# Patient Record
Sex: Male | Born: 1955 | Race: White | Hispanic: No | Smoking: Former smoker
Health system: Southern US, Community
[De-identification: ages and names within clinical notes are randomized; demographics above are authoritative.]

## PROBLEM LIST (undated history)

## (undated) ENCOUNTER — Ambulatory Visit (HOSPITAL_COMMUNITY): Admission: EM | Payer: Medicare HMO | Source: Home / Self Care

## (undated) DIAGNOSIS — I10 Essential (primary) hypertension: Secondary | ICD-10-CM

## (undated) DIAGNOSIS — R7303 Prediabetes: Secondary | ICD-10-CM

## (undated) DIAGNOSIS — K449 Diaphragmatic hernia without obstruction or gangrene: Secondary | ICD-10-CM

## (undated) DIAGNOSIS — E785 Hyperlipidemia, unspecified: Secondary | ICD-10-CM

## (undated) HISTORY — DX: Essential (primary) hypertension: I10

## (undated) HISTORY — PX: ANKLE FRACTURE SURGERY: SHX122

## (undated) HISTORY — PX: TONSILLECTOMY: SUR1361

## (undated) HISTORY — PX: POLYPECTOMY: SHX149

## (undated) HISTORY — DX: Diaphragmatic hernia without obstruction or gangrene: K44.9

## (undated) HISTORY — DX: Hyperlipidemia, unspecified: E78.5

---

## 2010-08-20 ENCOUNTER — Other Ambulatory Visit: Payer: Self-pay | Admitting: Internal Medicine

## 2012-08-23 ENCOUNTER — Encounter: Payer: Self-pay | Admitting: Physician Assistant

## 2012-08-23 ENCOUNTER — Ambulatory Visit (INDEPENDENT_AMBULATORY_CARE_PROVIDER_SITE_OTHER): Payer: Managed Care, Other (non HMO) | Admitting: Physician Assistant

## 2012-08-23 VITALS — BP 136/80 | HR 70 | Temp 97.6°F | Resp 16 | Ht 71.0 in | Wt 231.0 lb

## 2012-08-23 DIAGNOSIS — T148 Other injury of unspecified body region: Secondary | ICD-10-CM

## 2012-08-23 DIAGNOSIS — I10 Essential (primary) hypertension: Secondary | ICD-10-CM

## 2012-08-23 DIAGNOSIS — W57XXXA Bitten or stung by nonvenomous insect and other nonvenomous arthropods, initial encounter: Secondary | ICD-10-CM

## 2012-08-23 DIAGNOSIS — E782 Mixed hyperlipidemia: Secondary | ICD-10-CM

## 2012-08-23 MED ORDER — FENOFIBRATE 160 MG PO TABS: 160.0000 mg | ORAL_TABLET | Freq: Every day | ORAL | Status: DC

## 2012-08-23 NOTE — Progress Notes (Signed)
   Patient ID: Frederick Hicks MRN: [id number], DOB: 06/06/1955, 57 y.o. Date of Encounter: 08/23/2012, 9:47 AM    Chief Complaint:  Chief Complaint  Patient presents with  . other    tick bite     HPI: 57 y.o. year old male reports that he found a tic on his right flank 5 days ago. Removed the tic but htinks hte head otf the tic is still "in there.' wants it removed.  No fever, rash, myalgia, flu-like symptoms.  2- HTN: Taking HCTZ and Verapamil. Had lab 2/14.  3-HLD: Taking Fenofibrate. Wants to make sure whether to cont this. Had lab 2/14. LOV 5/13 for this.    Home Meds: See attached medication section for any medications that were entered at today's visit. The computer does not put those onto this list.The following list is a list of meds entered prior to today's visit.   No current outpatient prescriptions on file prior to visit.   No current facility-administered medications on file prior to visit.    Allergies: Not on File    Review of Systems: See HPI for pertinent ROS. All other ROS negative.    Physical Exam: Blood pressure 136/80, pulse 70, temperature 97.6 F (36.4 C), temperature source Oral, resp. rate 16, height 5\' 11"  (1.803 m), weight 231 lb (104.781 kg)., Body mass index is 32.23 kg/(m^2). General: WNWD WM. Appears in no acute distress. Neck: Supple. No thyromegaly. Full ROM. No lymphadenopathy. Lungs: Clear bilaterally to auscultation without wheezes, rales, or rhonchi. Breathing is unlabored. Heart: Regular rhythm. No murmurs, rubs, or gallops. Msk:  Strength and tone normal for age. Skin: Right flank; there is a scab that is approx 0.25x0.50 cm. I see a tiny black dot in right portion of this. I used scalpel tip and removed the head of the tic. There is no surrounding erythema and no purulent drainage. No sign of infection. No rash or erythema migrans.  Neuro: Alert and oriented X 3. Moves all extremities spontaneously. Gait is normal. CNII-XII grossly in  tact. Psych:  Responds to questions appropriately with a normal affect.     ASSESSMENT AND PLAN:  57 y.o. year old male with  1. Tick bite Head of the tic removed. No sign of infection. No sign/symptom of Lyme Disease or RMSF  2. HTN (hypertension) At [place]. BMET nml 05/2012. Cont current meds.  3. Mixed dyslipidemia 05/2010 Simvastatin was stopped sec to myalgias 09/2011 Fenofibrate was added. He was still on this at lab 05/2012. He is to cont Fenofibrate.  - fenofibrate 160 MG tablet; Take 1 tablet (160 mg total) by mouth daily.  Dispense: 30 tablet; Refill: 11  ROV with fasting lab in 6 months.  [address] [place], [place], [facility] 08/23/2012 9:47 AM

## 2012-10-12 ENCOUNTER — Other Ambulatory Visit: Payer: Self-pay | Admitting: Family Medicine

## 2013-02-21 ENCOUNTER — Other Ambulatory Visit: Payer: Self-pay | Admitting: Family Medicine

## 2013-03-01 ENCOUNTER — Telehealth: Payer: Self-pay | Admitting: *Deleted

## 2013-03-01 DIAGNOSIS — E782 Mixed hyperlipidemia: Secondary | ICD-10-CM

## 2013-03-01 MED ORDER — FENOFIBRATE 160 MG PO TABS: 160.0000 mg | ORAL_TABLET | Freq: Every day | ORAL | Status: DC

## 2013-03-01 NOTE — Telephone Encounter (Signed)
Meds refilled.

## 2013-04-09 ENCOUNTER — Telehealth: Payer: Self-pay | Admitting: Family Medicine

## 2013-04-09 MED ORDER — VERAPAMIL HCL ER 240 MG PO TBCR: EXTENDED_RELEASE_TABLET | ORAL | Status: DC

## 2013-04-09 MED ORDER — HYDROCHLOROTHIAZIDE 25 MG PO TABS: ORAL_TABLET | ORAL | Status: DC

## 2013-04-09 NOTE — Telephone Encounter (Signed)
Needs HCTZ and Verapamil refilled- [facility] [address]

## 2013-04-09 NOTE — Telephone Encounter (Signed)
Rx Refilled  

## 2013-05-27 ENCOUNTER — Ambulatory Visit: Payer: Self-pay | Admitting: Family Medicine

## 2013-05-29 ENCOUNTER — Ambulatory Visit (INDEPENDENT_AMBULATORY_CARE_PROVIDER_SITE_OTHER): Payer: BC Managed Care – PPO | Admitting: Family Medicine

## 2013-05-29 ENCOUNTER — Encounter: Payer: Self-pay | Admitting: Family Medicine

## 2013-05-29 VITALS — BP 146/94 | HR 80 | Temp 97.3°F | Resp 18 | Ht 73.0 in | Wt 222.0 lb

## 2013-05-29 DIAGNOSIS — M25579 Pain in unspecified ankle and joints of unspecified foot: Secondary | ICD-10-CM

## 2013-05-29 DIAGNOSIS — G629 Polyneuropathy, unspecified: Secondary | ICD-10-CM

## 2013-05-29 DIAGNOSIS — G589 Mononeuropathy, unspecified: Secondary | ICD-10-CM

## 2013-05-29 NOTE — Progress Notes (Signed)
Subjective:    Patient ID: Frederick Hicks, male    DOB: 1955/12/30, 58 y.o.   MRN: [id number]  HPI Patient is here today for 2 separate medical problems. #1 he complains of sudden and severe pain in his right ankle that occurred after he drove many hours.  He denies any injury to the ankle. The pain lasted approximately one week. He states the ankle is red hot and swollen. It gradually subsided on his iron. He had similar attack in his left knee a few years ago he was installing a former. He believes he may have gout. He has a strong family history of gout in his on call and in his grandfather. He consumes 2-3 I called beverages per night and eats high protein diet. He is also on hydrochlorothiazide.  #2 the patient complains of burning and stinging pain in both feet particularly in the toes. It is worse at the end of the day after he has been walking on hard concrete floors for several hours. It will gradually subside after he is off his feet.  He denies any low back pain or symptoms of cauda equina syndrome. It occurs equally and symmetrically in both feet. No past medical history on file. Current Outpatient Prescriptions on File Prior to Visit  Medication Sig Dispense Refill  . aspirin 81 MG tablet Take 81 mg by mouth daily.      . fenofibrate 160 MG tablet Take 1 tablet (160 mg total) by mouth daily.  30 tablet  11  . hydrochlorothiazide (HYDRODIURIL) 25 MG tablet TAKE 1 TABLET BY MOUTH DAILY  30 tablet  2  . verapamil (CALAN-SR) 240 MG CR tablet TAKE 1 TABLET BY MOUTH DAILY  30 tablet  2   No current facility-administered medications on file prior to visit.   Allergies  Allergen Reactions  . Lisinopril Cough  . Simvastatin Other (See Comments)    Mylagias   History   Social History  . Marital Status: Widowed    Spouse Name: N/A    Number of Children: N/A  . Years of Education: N/A   Occupational History  . Not on file.   Social History Main Topics  . Smoking status: Current  Every Day Smoker    Types: Cigars  . Smokeless tobacco: Not on file  . Alcohol Use: Yes  . Drug Use: No  . Sexual Activity: Not on file   Other Topics Concern  . Not on file   Social History Narrative  . No narrative on file      Review of Systems  All other systems reviewed and are negative.       Objective:   Physical Exam  Vitals reviewed. Constitutional: He is oriented to person, place, and time.  Neck: Neck supple. No thyromegaly present.  Cardiovascular: Normal rate, regular rhythm and normal heart sounds.   Pulmonary/Chest: Effort normal and breath sounds normal.  Abdominal: Soft. Bowel sounds are normal.  Musculoskeletal: Normal range of motion. He exhibits no edema and no tenderness.  Neurological: He is alert and oriented to person, place, and time. He has normal reflexes. He displays normal reflexes. No cranial nerve deficit. He exhibits normal muscle tone. Coordination normal.   examination of the right ankle today is normal. He has normal range of motion without pain. The ankle is stable. There is no evidence of any injury. This patient states he feels much better. Examination of the skin on his feet and his sensation is normal  Assessment & Plan:  1. 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 I will rule out reversible causes of neuropathy by checking TSH B12 and hemoglobin A1c - COMPLETE METABOLIC PANEL WITH GFR - TSH - Vitamin B12 - Hemoglobin A1c  2. Pain in joint, ankle and foot Check a uric acid level. Given the frequency of the attacks, the patient does not warrant starting a daily medicine to prevent gout attacks at the present time. However if his uric acid level is significantly elevated I would change his hydrochlorothiazide to a safer blood pressure medicine. Also recommended a low-protein diet and general restriction on alcohol consumption. - Uric acid

## 2013-05-30 LAB — COMPLETE METABOLIC PANEL WITH GFR
ALBUMIN: 4.5 g/dL (ref 3.5–5.2)
ALK PHOS: 61 U/L (ref 39–117)
ALT: 28 U/L (ref 0–53)
AST: 20 U/L (ref 0–37)
BUN: 12 mg/dL (ref 6–23)
CALCIUM: 9.4 mg/dL (ref 8.4–10.5)
CO2: 28 meq/L (ref 19–32)
Chloride: 102 mEq/L (ref 96–112)
Creat: 0.97 mg/dL (ref 0.50–1.35)
GFR, EST NON AFRICAN AMERICAN: 85 mL/min
GLUCOSE: 82 mg/dL (ref 70–99)
POTASSIUM: 3.7 meq/L (ref 3.5–5.3)
SODIUM: 137 meq/L (ref 135–145)
TOTAL PROTEIN: 6.7 g/dL (ref 6.0–8.3)
Total Bilirubin: 0.5 mg/dL (ref 0.2–1.2)

## 2013-05-30 LAB — TSH: TSH: 2.343 u[IU]/mL (ref 0.350–4.500)

## 2013-05-30 LAB — VITAMIN B12: Vitamin B-12: 282 pg/mL (ref 211–911)

## 2013-05-30 LAB — HEMOGLOBIN A1C
HEMOGLOBIN A1C: 5.6 % (ref <5.7)
Mean Plasma Glucose: 114 mg/dL (ref <117)

## 2013-05-30 LAB — URIC ACID: Uric Acid, Serum: 6.2 mg/dL (ref 4.0–7.8)

## 2013-07-20 ENCOUNTER — Other Ambulatory Visit: Payer: Self-pay | Admitting: Family Medicine

## 2013-07-26 ENCOUNTER — Telehealth: Payer: Self-pay | Admitting: Physician Assistant

## 2013-07-26 MED ORDER — VERAPAMIL HCL ER 240 MG PO TBCR: 240.0000 mg | EXTENDED_RELEASE_TABLET | Freq: Every day | ORAL | Status: DC

## 2013-07-26 MED ORDER — HYDROCHLOROTHIAZIDE 25 MG PO TABS: 25.0000 mg | ORAL_TABLET | Freq: Every day | ORAL | Status: DC

## 2013-07-26 NOTE — Telephone Encounter (Signed)
Medication refilled per protocol. 

## 2013-07-26 NOTE — Telephone Encounter (Signed)
Call back number is [phone] Pharmacy [facility] is needing a refill verapamil (CALAN-SR) 240 MG CR tablet hydrochlorothiazide (HYDRODIURIL) 25 MG tablet

## 2013-11-29 ENCOUNTER — Other Ambulatory Visit: Payer: Self-pay | Admitting: Family Medicine

## 2013-11-29 NOTE — Telephone Encounter (Signed)
Refill appropriate and filled per protocol. 

## 2014-04-11 ENCOUNTER — Other Ambulatory Visit: Payer: Self-pay | Admitting: Physician Assistant

## 2014-04-13 NOTE — Telephone Encounter (Signed)
Refill appropriate and filled per protocol. 

## 2014-08-17 ENCOUNTER — Other Ambulatory Visit: Payer: Self-pay | Admitting: Physician Assistant

## 2014-12-31 ENCOUNTER — Other Ambulatory Visit: Payer: Self-pay | Admitting: Physician Assistant

## 2014-12-31 NOTE — Telephone Encounter (Signed)
Medication filled x1 with no refills.   Requires office visit before any further refills can be given.   Letter sent.  

## 2015-02-11 ENCOUNTER — Encounter: Payer: Self-pay | Admitting: Physician Assistant

## 2015-02-11 ENCOUNTER — Ambulatory Visit (INDEPENDENT_AMBULATORY_CARE_PROVIDER_SITE_OTHER): Payer: BLUE CROSS/BLUE SHIELD | Admitting: Physician Assistant

## 2015-02-11 VITALS — BP 134/104 | HR 72 | Temp 98.4°F | Resp 18 | Wt 225.0 lb

## 2015-02-11 DIAGNOSIS — I10 Essential (primary) hypertension: Secondary | ICD-10-CM | POA: Insufficient documentation

## 2015-02-11 DIAGNOSIS — E785 Hyperlipidemia, unspecified: Secondary | ICD-10-CM

## 2015-02-11 MED ORDER — LOSARTAN POTASSIUM 50 MG PO TABS: 50.0000 mg | ORAL_TABLET | Freq: Every day | ORAL | Status: DC

## 2015-02-12 LAB — LIPID PANEL
CHOL/HDL RATIO: 5.5 ratio — AB (ref <=5.0)
CHOLESTEROL: 194 mg/dL (ref 125–200)
HDL: 35 mg/dL — AB (ref >=40)
LDL Cholesterol: 118 mg/dL (ref <130)
Triglycerides: 203 mg/dL — ABNORMAL HIGH (ref <150)
VLDL: 41 mg/dL — ABNORMAL HIGH (ref <30)

## 2015-02-12 LAB — COMPLETE METABOLIC PANEL WITH GFR
ALBUMIN: 4.4 g/dL (ref 3.6–5.1)
ALK PHOS: 63 U/L (ref 40–115)
ALT: 70 U/L — ABNORMAL HIGH (ref 9–46)
AST: 41 U/L — ABNORMAL HIGH (ref 10–35)
BUN: 14 mg/dL (ref 7–25)
CALCIUM: 9.8 mg/dL (ref 8.6–10.3)
CO2: 30 mmol/L (ref 20–31)
Chloride: 101 mmol/L (ref 98–110)
Creat: 0.92 mg/dL (ref 0.70–1.25)
GFR, Est African American: >89 mL/min (ref >=60)
GFR, Est Non African American: >89 mL/min (ref >=60)
Glucose, Bld: 82 mg/dL (ref 70–99)
POTASSIUM: 4.2 mmol/L (ref 3.5–5.3)
Sodium: 138 mmol/L (ref 135–146)
Total Bilirubin: 0.7 mg/dL (ref 0.2–1.2)
Total Protein: 7 g/dL (ref 6.1–8.1)

## 2015-02-12 NOTE — Progress Notes (Signed)
Patient ID: Frederick Hicks MRN: [id number], DOB: Apr 03, 1956, 59 y.o. Date of Encounter: @DATE @  Chief Complaint:  Chief Complaint  Patient presents with  . Medication Refill  . start of a cold    OK for flu and Zostavax??    HPI: 59 y.o. year old white male  presents for above.  His last office visit with me was on 08/23/2012. At that time for hypertension he was taking HCTZ and verapamil. At that time for hyperlipidemia he was taking fenofibrate.  Today he reports that he quit the fenofibrate about a year ago. He is still taking HCTZ and verapamil daily.  He reports that just yesterday he started feeling his throat get a little scratchy and also started having some sneezing and coughing. No fevers or chills. No significant sore throat. No significant mucus from the nose or phlegm from the chest.  History reviewed. No pertinent past medical history.   Home Meds: Outpatient Prescriptions Prior to Visit  Medication Sig Dispense Refill  . aspirin 81 MG tablet Take 81 mg by mouth daily.    . hydrochlorothiazide (HYDRODIURIL) 25 MG tablet TAKE 1 TABLET BY MOUTH EVERY DAY 30 tablet 0  . verapamil (CALAN-SR) 240 MG CR tablet TAKE 1 TABLET BY MOUTH DAILY 30 tablet 0  . fenofibrate 160 MG tablet Take 1 tablet (160 mg total) by mouth daily. (Patient not taking: Reported on 02/11/2015) 30 tablet 11   No facility-administered medications prior to visit.    Allergies:  Allergies  Allergen Reactions  . Lisinopril Cough  . Simvastatin Other (See Comments)    Mylagias    Social History   Social History  . Marital Status: Widowed    Spouse Name: N/A  . Number of Children: N/A  . Years of Education: N/A   Occupational History  . Not on file.   Social History Main Topics  . Smoking status: Former Smoker    Types: Cigars    Quit date: 04/18/1997  . Smokeless tobacco: Not on file  . Alcohol Use: Yes  . Drug Use: No  . Sexual Activity: Not on file   Other Topics Concern  .  Not on file   Social History Narrative    Family History  Problem Relation Age of Onset  . Gout Maternal Uncle   . Gout Maternal Grandfather      Review of Systems:  See HPI for pertinent ROS. All other ROS negative.    Physical Exam: Blood pressure 134/104, pulse 72, temperature 98.4 F (36.9 C), temperature source Oral, resp. rate 18, weight 225 lb (102.059 kg)., Body mass index is 29.69 kg/(m^2).  Repeat blood pressure by myself I got on the left 160/100 and on the right 160/100 General: WNWD WM. Appears in no acute distress. Head: Normocephalic, atraumatic, eyes without discharge, sclera non-icteric, nares are without discharge. Bilateral auditory canals clear, TM's are without perforation, pearly grey and translucent with reflective cone of light bilaterally. Oral cavity moist, posterior pharynx without exudate, erythema, peritonsillar abscess.  Neck: Supple. No thyromegaly. No lymphadenopathy. No carotid bruits Lungs: Clear bilaterally to auscultation without wheezes, rales, or rhonchi. Breathing is unlabored. Heart: RRR with S1 S2. No murmurs, rubs, or gallops. Abdomen: Soft, non-tender, non-distended with normoactive bowel sounds. No hepatomegaly. No rebound/guarding. No obvious abdominal masses. Musculoskeletal:  Strength and tone normal for age. Extremities/Skin: Warm and dry.No edema.  Neuro: Alert and oriented X 3. Moves all extremities spontaneously. Gait is normal. CNII-XII grossly in tact. Psych:  Responds  to questions appropriately with a normal affect.     ASSESSMENT AND PLAN:  59 y.o. year old male with  1. Essential hypertension H/O Cough with ACE Inh I rechecked blood pressure bilaterally and got 160/100 on each arm. During our conversation he does mention that he was put on verapamil because his heart rate was high in the past. Therefore,  will not change this. Will add losartan 50 mg and also continue his current medications. He is agreeable to return in 2  weeks to follow-up blood pressure and also for complete physical exam at that time. - COMPLETE METABOLIC PANEL WITH GFR - losartan (COZAAR) 50 MG tablet; Take 1 tablet (50 mg total) by mouth daily.  Dispense: 30 tablet; Refill: 3  2. Dyslipidemia He reports that at 9:30 AM he had a diet Coke and a cheese stick. Nothing to eat or drink since then. Therefore will go ahead and check fasting lipid and CMET - COMPLETE METABOLIC PANEL WITH GFR - Lipid panel  He is going to return for follow-up visit in 2 weeks and will schedule that visit as a complete physical exam. Will update immunizations at that time.    Signed, [address] [place], [place], [facility] 02/12/2015 7:35 AM

## 2015-02-16 ENCOUNTER — Other Ambulatory Visit: Payer: Self-pay | Admitting: Physician Assistant

## 2015-02-18 NOTE — Telephone Encounter (Signed)
Medication refilled per protocol. 

## 2015-02-25 ENCOUNTER — Ambulatory Visit (INDEPENDENT_AMBULATORY_CARE_PROVIDER_SITE_OTHER): Payer: BLUE CROSS/BLUE SHIELD | Admitting: Physician Assistant

## 2015-02-25 ENCOUNTER — Encounter: Payer: Self-pay | Admitting: Physician Assistant

## 2015-02-25 VITALS — BP 162/92 | HR 70 | Temp 98.6°F | Resp 16 | Wt 225.0 lb

## 2015-02-25 DIAGNOSIS — Z1212 Encounter for screening for malignant neoplasm of rectum: Secondary | ICD-10-CM | POA: Diagnosis not present

## 2015-02-25 DIAGNOSIS — Z125 Encounter for screening for malignant neoplasm of prostate: Secondary | ICD-10-CM

## 2015-02-25 DIAGNOSIS — Z0000 Encounter for general adult medical examination without abnormal findings: Secondary | ICD-10-CM | POA: Diagnosis not present

## 2015-02-25 DIAGNOSIS — Z1211 Encounter for screening for malignant neoplasm of colon: Secondary | ICD-10-CM

## 2015-02-25 DIAGNOSIS — I10 Essential (primary) hypertension: Secondary | ICD-10-CM

## 2015-02-26 ENCOUNTER — Encounter: Payer: Self-pay | Admitting: Physician Assistant

## 2015-02-26 MED ORDER — LOSARTAN POTASSIUM 100 MG PO TABS: 100.0000 mg | ORAL_TABLET | Freq: Every day | ORAL | Status: DC

## 2015-02-26 NOTE — Progress Notes (Signed)
Patient ID: Frederick Hicks MRN: [id number], DOB: [birth year] 59 y.o. Date of Encounter: 02/26/2015, 8:00 AM    Chief Complaint: Physical (CPE)  HPI: 59 y.o. y/o white male here for CPE.   He has no specific complaints or concerns today. He did have recent office visit with me at which time his blood pressure was elevated. At that visit we added losartan 50 mg daily to his other medications. He states that he is taking this daily with no adverse effects.   Review of Systems: Consitutional: No fever, chills, fatigue, night sweats, lymphadenopathy, or weight changes. Eyes: No visual changes, eye redness, or discharge. ENT/Mouth: Ears: No otalgia, tinnitus, hearing loss, discharge. Nose: No congestion, rhinorrhea, sinus pain, or epistaxis. Throat: No sore throat, post nasal drip, or teeth pain. Cardiovascular: No CP, palpitations, diaphoresis, DOE, edema, orthopnea, PND. Respiratory: No cough, hemoptysis, SOB, or wheezing. Gastrointestinal: No anorexia, dysphagia, reflux, pain, nausea, vomiting, hematemesis, diarrhea, constipation, BRBPR, or melena. Genitourinary: No dysuria, frequency, urgency, hematuria, incontinence, nocturia, decreased urinary stream, discharge, impotence, or testicular pain/masses. Musculoskeletal: No decreased ROM, myalgias, stiffness, joint swelling, or weakness. Skin: No rash, erythema, lesion changes, pain, warmth, jaundice, or pruritis. Neurological: No headache, dizziness, syncope, seizures, tremors, memory loss, coordination problems, or paresthesias. Psychological: No anxiety, depression, hallucinations, SI/HI. Endocrine: No fatigue, polydipsia, polyphagia, polyuria, or known diabetes. All other systems were reviewed and are otherwise negative.  Past Medical History  Diagnosis Date  . Hypertension   . Hiatal hernia     He reports that in his 60s he smoked a lot. Says that he smoked menthol cigarettes which he has been told are the very worst for you. Says  he was smoking 2 packs per day. Says that he ended up having an endoscopy and colonoscopy and was told he had significant esophagitis and hiatal hernia. He has quit smoking. He does drink 3 or 4 beers each day. Says that he has never been able to drink more than 5 beers per day so has never had heavier alcohol use. At visit 02/25/15 discussed decreasing his alcohol and he says that he can definitely decrease that/stop it. Says that regarding the hiatal hernia he got in the habit of sleeping propped up on pillows so he still does this. However he says that he does not have any reflux symptoms and he avoids [place] foods and spicy foods like that.  History reviewed. No pertinent past surgical history.  Home Meds:  Outpatient Prescriptions Prior to Visit  Medication Sig Dispense Refill  . aspirin 81 MG tablet Take 81 mg by mouth daily.    . hydrochlorothiazide (HYDRODIURIL) 25 MG tablet TAKE 1 TABLET BY MOUTH EVERY DAY 30 tablet 0  . verapamil (CALAN-SR) 240 MG CR tablet TAKE 1 TABLET BY MOUTH DAILY 30 tablet 0  . fenofibrate 160 MG tablet Take 1 tablet (160 mg total) by mouth daily. (Patient not taking: Reported on 02/11/2015) 30 tablet 11  . losartan (COZAAR) 50 MG tablet Take 1 tablet (50 mg total) by mouth daily. 30 tablet 3   No facility-administered medications prior to visit.    Allergies:  Allergies  Allergen Reactions  . Lisinopril Cough  . Simvastatin Other (See Comments)    Mylagias    Social History   Social History  . Marital Status: Widowed    Spouse Name: N/A  . Number of Children: N/A  . Years of Education: N/A   Occupational History  . Not on file.   Social History Main  Topics  . Smoking status: Former Smoker    Types: Cigars    Quit date: 04/18/1997  . Smokeless tobacco: Not on file  . Alcohol Use: 10.8 oz/week    18 Cans of beer per week  . Drug Use: No  . Sexual Activity: Not on file   Other Topics Concern  . Not on file   Social History Narrative    Wife passed away--secondary to acute MI   Pt works--binding books   Has son and grandchildren nearby    Family History  Problem Relation Age of Onset  . Gout Maternal Uncle   . Gout Maternal Grandfather   . 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 rate 16, weight 225 lb (102.059 kg).  General: Well developed, well nourished, WM. Appears in no acute distress. HEENT: Normocephalic, atraumatic. Conjunctiva pink, sclera non-icteric. Pupils 2 mm constricting to 1 mm, round, regular, and equally reactive to light and accomodation. EOMI. Internal auditory canal clear. TMs with good cone of light and without pathology. Nasal mucosa pink. Nares are without discharge. No sinus tenderness. Oral mucosa pink. Pharynx without exudate.   Neck: Supple. Trachea midline. No thyromegaly. Full ROM. No lymphadenopathy. No carotid bruit. Lungs: Clear to auscultation bilaterally without wheezes, rales, or rhonchi. Breathing is of normal effort and unlabored. Cardiovascular: RRR with S1 S2. No murmurs, rubs, or gallops. Distal pulses 2+ symmetrically. No carotid or abdominal bruits. Abdomen: Soft, non-tender, non-distended with normoactive bowel sounds. No hepatosplenomegaly or masses. No rebound/guarding. No CVA tenderness. No hernias. Rectal: No external hemorrhoids or fissures. Rectal vault without masses. Prostate gland firm and smooth. No nodularity, tenderness, mass, or induration.  Musculoskeletal: Full range of motion and 5/5 strength throughout. Skin: Warm and moist without erythema, ecchymosis, wounds, or rash. Neuro: A+Ox3. CN II-XII grossly intact. Moves all extremities spontaneously. Full sensation throughout. Normal gait. DTR 2+ throughout upper and lower extremities.  Psych:  Responds to questions appropriately with a normal affect.   Assessment/Plan:  59 y.o. y/o  white male here  for CPE  -1. Visit for preventive health examination  A. Screening Labs: Our office lost power at the time of his visit. He just recently had FLP/LFT. Therefore, do not need to do FLP with this set of labs.  He will return for lab work.  - BASIC METABOLIC PANEL WITH GFR; Future - CBC with Differential/Platelet; Future - TSH; Future - Vit D  25 hydroxy (rtn osteoporosis monitoring); Future - PSA; Future  B. Screening For Prostate Cancer: Our office lost power at the time of his visit.  He will return for lab work.  - PSA; Future  C. Screening For Colorectal Cancer:  He reports that he had a colonoscopy at age 65. Says that he was to follow-up at age 73. Says that at that time he was traveling and missed that colonoscopy and never had it done. Says that the prior colonoscopy was performed in [place]. Showed polyps. He is agreeable for me to place the order for referral to GI for screening colonoscopy at this point. - Ambulatory referral to Gastroenterology  D. Immunizations: Due to power outage, immunizations have been transferred out of our building in order to keep them refrigerated.  Patient will return for immunizations. At that time he will get influenza vaccine and TdaP. He states that he does want to get the seasonal flu vaccine and  states that his last tetanus shot was about 9 or 10 years ago and he does get cut a lot at work with his job so is agreeable to update this. He has no indication to require a pneumonia vaccine until age 18. Regarding the Zostavax he needs to contact his insurance to find out the insurance coverage and what his cost would be and decide whether he wants to receive this.  Flu Tetanus Pneumococcal Zostavax   2. Screening for colorectal cancer He reports that he had a colonoscopy at age 73. Says that he was to follow-up at age 51. Says that at that time he was traveling and missed that colonoscopy and never had it done. Says that the prior  colonoscopy was performed in [place]. Showed polyps. He is agreeable for me to place the order for referral to GI for screening colonoscopy at this point. - Ambulatory referral to Gastroenterology  3. Screening for prostate cancer Our office lost power at the time of his visit.  He will return for lab work.  - PSA; Future  4. Essential hypertension Blood pressure is still elevated. Increase Cozaar from 50 mg to 100 mg daily. Will recheck BMET on this increased dose. He is to return in one week to recheck blood pressure. - BASIC METABOLIC PANEL WITH GFR; Future - losartan (COZAAR) 100 MG tablet; Take 1 tablet (100 mg total) by mouth daily.  Dispense: 30 tablet; Refill: 0  5. History of Hypertriglyceridemia: Recently came and had lipid panel performed. I reviewed these results with him today. He had stopped the fenofibrate about one year ago. Told him that he can stop stay off of this and I have removed this off of the medicine list today. He states that in the past he smoked a lot and drank alcohol and had a poor diet. Says that his diet has changed significantly and he now eats a lot of fish and a lot of vegetables. Also has quit smoking. Discussed that with these lifestyle changes, his lipid panel is now good and he does not require any medication for this.    Signed:   [address] [place], [facility]  02/26/2015 8:00 AM

## 2015-02-27 ENCOUNTER — Encounter: Payer: Self-pay | Admitting: Internal Medicine

## 2015-02-27 ENCOUNTER — Ambulatory Visit (INDEPENDENT_AMBULATORY_CARE_PROVIDER_SITE_OTHER): Payer: BLUE CROSS/BLUE SHIELD | Admitting: *Deleted

## 2015-02-27 DIAGNOSIS — Z23 Encounter for immunization: Secondary | ICD-10-CM

## 2015-03-03 ENCOUNTER — Other Ambulatory Visit: Payer: BLUE CROSS/BLUE SHIELD

## 2015-03-03 ENCOUNTER — Ambulatory Visit: Payer: BLUE CROSS/BLUE SHIELD | Admitting: Family Medicine

## 2015-03-03 VITALS — BP 146/90

## 2015-03-03 DIAGNOSIS — Z0000 Encounter for general adult medical examination without abnormal findings: Secondary | ICD-10-CM

## 2015-03-03 DIAGNOSIS — I10 Essential (primary) hypertension: Secondary | ICD-10-CM

## 2015-03-03 DIAGNOSIS — Z125 Encounter for screening for malignant neoplasm of prostate: Secondary | ICD-10-CM

## 2015-03-04 ENCOUNTER — Telehealth: Payer: Self-pay | Admitting: Family Medicine

## 2015-03-04 DIAGNOSIS — E559 Vitamin D deficiency, unspecified: Secondary | ICD-10-CM

## 2015-03-04 DIAGNOSIS — I10 Essential (primary) hypertension: Secondary | ICD-10-CM

## 2015-03-04 LAB — BASIC METABOLIC PANEL WITH GFR
BUN: 14 mg/dL (ref 7–25)
CHLORIDE: 104 mmol/L (ref 98–110)
CO2: 26 mmol/L (ref 20–31)
CREATININE: 1.02 mg/dL (ref 0.70–1.25)
Calcium: 9.3 mg/dL (ref 8.6–10.3)
GFR, Est African American: >89 mL/min (ref >=60)
GFR, Est Non African American: 80 mL/min (ref >=60)
GLUCOSE: 80 mg/dL (ref 70–99)
POTASSIUM: 4 mmol/L (ref 3.5–5.3)
Sodium: 139 mmol/L (ref 135–146)

## 2015-03-04 LAB — CBC WITH DIFFERENTIAL/PLATELET
Basophils Absolute: 0 10*3/uL (ref 0.0–0.1)
Basophils Relative: 0 % (ref 0–1)
Eosinophils Absolute: 0.1 10*3/uL (ref 0.0–0.7)
Eosinophils Relative: 2 % (ref 0–5)
HEMATOCRIT: 40.4 % (ref 39.0–52.0)
HEMOGLOBIN: 13.9 g/dL (ref 13.0–17.0)
LYMPHS ABS: 2.2 10*3/uL (ref 0.7–4.0)
LYMPHS PCT: 37 % (ref 12–46)
MCH: 32.1 pg (ref 26.0–34.0)
MCHC: 34.4 g/dL (ref 30.0–36.0)
MCV: 93.3 fL (ref 78.0–100.0)
MONO ABS: 0.6 10*3/uL (ref 0.1–1.0)
MONOS PCT: 11 % (ref 3–12)
MPV: 9.9 fL (ref 8.6–12.4)
NEUTROS ABS: 3 10*3/uL (ref 1.7–7.7)
Neutrophils Relative %: 50 % (ref 43–77)
Platelets: 223 10*3/uL (ref 150–400)
RBC: 4.33 MIL/uL (ref 4.22–5.81)
RDW: 13.6 % (ref 11.5–15.5)
WBC: 5.9 10*3/uL (ref 4.0–10.5)

## 2015-03-04 LAB — VITAMIN D 25 HYDROXY (VIT D DEFICIENCY, FRACTURES): VIT D 25 HYDROXY: 16 ng/mL — AB (ref 30–100)

## 2015-03-04 LAB — TSH: TSH: 2.091 u[IU]/mL (ref 0.350–4.500)

## 2015-03-04 LAB — PSA: PSA: 1.3 ng/mL (ref <=4.00)

## 2015-03-04 MED ORDER — NEBIVOLOL HCL 5 MG PO TABS: 5.0000 mg | ORAL_TABLET | Freq: Every day | ORAL | Status: DC

## 2015-03-04 MED ORDER — LOSARTAN POTASSIUM 100 MG PO TABS: 100.0000 mg | ORAL_TABLET | Freq: Every day | ORAL | Status: DC

## 2015-03-04 NOTE — Telephone Encounter (Signed)
-----   Message from Orlena Sheldon, PA-C sent at 03/04/2015  7:41 AM EST ----- He also came in yesterday and had nurse BP check. BP was 146/90.  Tell him to add Bystolic 5mg  QD---Add this to med list---send Rx for # 90---tell him to come by the office and get box of samples and Savings card---can get #90 for $30--so send order for #90 (He says he drives by here on way home from work--should be able to come by and pick this up) Also, add 5 months of refills to Losartan.  Regarding labs: Vitamin D is low---Add otc Vitamin D 4,000 units daily--add to med list. Add Vitamin D Deficiency to Problem List Remainder of labs normal.  Have him schedule f/u OV in one month. Tell him that after that OV, will only need to come every 6 months.

## 2015-03-04 NOTE — Telephone Encounter (Signed)
Pt aware of lab results and provider recommendations.  Rx's to pharmacy.  Discount card left at front desk for pick up.  Understands need for 1 month follow up

## 2015-03-17 ENCOUNTER — Telehealth: Payer: Self-pay | Admitting: Family Medicine

## 2015-03-17 NOTE — Telephone Encounter (Signed)
Ok. Agree

## 2015-03-17 NOTE — Telephone Encounter (Signed)
Pt checking BP at home.  Took immediately after getting home from work and was 160/83  P-54.  Then rested 15-20 min and got 127/75  P-48.  Told him theses were fine.  Made 1 moth follow up appt.  Told him to bring BP log with him to appt.

## 2015-03-19 ENCOUNTER — Ambulatory Visit (AMBULATORY_SURGERY_CENTER): Payer: Self-pay

## 2015-03-19 VITALS — Ht 73.0 in | Wt 227.6 lb

## 2015-03-19 DIAGNOSIS — Z860100 Personal history of colon polyps, unspecified: Secondary | ICD-10-CM

## 2015-03-19 MED ORDER — SUPREP BOWEL PREP KIT 17.5-3.13-1.6 GM/177ML PO SOLN
1.0000 | Freq: Once | ORAL | Status: DC
Start: 2015-03-19 — End: 2015-11-02

## 2015-03-19 NOTE — Progress Notes (Signed)
No home oxygen No diet/weight loss meds No past problems with anesthesia No allergies to eggs or soy  Has email and internet; registered for emmi

## 2015-03-24 ENCOUNTER — Encounter: Payer: Self-pay | Admitting: Internal Medicine

## 2015-03-26 ENCOUNTER — Encounter: Payer: Self-pay | Admitting: Physician Assistant

## 2015-03-26 ENCOUNTER — Ambulatory Visit (INDEPENDENT_AMBULATORY_CARE_PROVIDER_SITE_OTHER): Payer: BLUE CROSS/BLUE SHIELD | Admitting: Physician Assistant

## 2015-03-26 VITALS — BP 154/90 | HR 60 | Temp 98.2°F | Resp 18 | Wt 227.0 lb

## 2015-03-26 DIAGNOSIS — E559 Vitamin D deficiency, unspecified: Secondary | ICD-10-CM

## 2015-03-26 DIAGNOSIS — I10 Essential (primary) hypertension: Secondary | ICD-10-CM | POA: Diagnosis not present

## 2015-03-26 NOTE — Progress Notes (Signed)
Patient ID: Frederick Hicks MRN: [id number], DOB: 01-04-1956 59 y.o. Date of Encounter: 03/26/2015, 4:36 PM    Chief Complaint: F/U HTN.  HPI: 59 y.o. y/o white male here for f/u HTN.   He had been seeing me in the past but then his last visit with me was 08/23/2012 until his recent visits outlined below. At his visit 08/23/12 he was on medication for hypertension--- was taking HCTZ and verapamil. At that time for hyperlipidemia he was taking fenofibrate.  At his visit with me 08/23/12 he reported that he had quit the fenofibrate about one year prior. At that visit he was still taking the HCTZ and verapamil daily.  At follow-up visit with me 02/11/15. At that time blood pressure was elevated and losartan 50 mg was added. That visit he was agreeable to return in 2 weeks to follow-up blood pressure and also for complete physical exam. Checked a lipid panel on 02/11/15. Said that he could stay off of the fenofibrate.  He returned for complete physical exam 02/25/15. He was taking that losartan 50 mg daily in addition to his other medicines. At that visit his blood pressure was still elevated so the Cozaar was increased from 50 mg to 100 mg.  He returned for nurse blood pressure check on 03/04/15. Blood pressure was 146/90. That time we told him to add Bystolic 5 mg daily. So at that time had reviewed lab results and his vitamin D was low and told him to add over-the-counter vitamin D 4000 units daily.  Today he states that he is taking the Bystolic 5 mg daily. States that he is taking over-the-counter vitamin D 4000 units daily.  He states he has a new blood pressure cuff and has been checking his blood pressure at home. sAYs that he has been getting good readings there and much lower than the reading the nurse had today. Says last night it was 117/68. His other readings have been like 127 over 70s and in that range.   Taking all medications as directed. Has no complaints or concerns  today.  Review of Systems: Consitutional: No fever, chills, fatigue, night sweats, lymphadenopathy, or weight changes. Eyes: No visual changes, eye redness, or discharge. ENT/Mouth: Ears: No otalgia, tinnitus, hearing loss, discharge. Nose: No congestion, rhinorrhea, sinus pain, or epistaxis. Throat: No sore throat, post nasal drip, or teeth pain. Cardiovascular: No CP, palpitations, diaphoresis, DOE, edema, orthopnea, PND. Respiratory: No cough, hemoptysis, SOB, or wheezing. Gastrointestinal: No anorexia, dysphagia, reflux, pain, nausea, vomiting, hematemesis, diarrhea, constipation, BRBPR, or melena. Genitourinary: No dysuria, frequency, urgency, hematuria, incontinence, nocturia, decreased urinary stream, discharge, impotence, or testicular pain/masses. Musculoskeletal: No decreased ROM, myalgias, stiffness, joint swelling, or weakness. Skin: No rash, erythema, lesion changes, pain, warmth, jaundice, or pruritis. Neurological: No headache, dizziness, syncope, seizures, tremors, memory loss, coordination problems, or paresthesias. Psychological: No anxiety, depression, hallucinations, SI/HI. Endocrine: No fatigue, polydipsia, polyphagia, polyuria, or known diabetes. All other systems were reviewed and are otherwise negative.  Past Medical History  Diagnosis Date  . Hypertension   . Hiatal hernia     He reports that in his 38s he smoked a lot. Says that he smoked menthol cigarettes which he has been told are the very worst for you. Says he was smoking 2 packs per day. Says that he ended up having an endoscopy and colonoscopy and was told he had significant esophagitis and hiatal hernia. He has quit smoking. He does drink 3 or 4 beers each day. Says that  he has never been able to drink more than 5 beers per day so has never had heavier alcohol use. At visit 02/25/15 discussed decreasing his alcohol and he says that he can definitely decrease that/stop it. Says that regarding the hiatal hernia  he got in the habit of sleeping propped up on pillows so he still does this. However he says that he does not have any reflux symptoms and he avoids [place] foods and spicy foods like that.  Past Surgical History  Procedure Laterality Date  . Tonsillectomy    . Ankle fracture surgery      left, pinning    Home Meds:  Outpatient Prescriptions Prior to Visit  Medication Sig Dispense Refill  . aspirin 81 MG tablet Take 81 mg by mouth daily.    . Cholecalciferol (SM VITAMIN D3) 4000 UNITS CAPS Take 1 capsule by mouth daily.    . hydrochlorothiazide (HYDRODIURIL) 25 MG tablet TAKE 1 TABLET BY MOUTH EVERY DAY 30 tablet 0  . losartan (COZAAR) 100 MG tablet Take 1 tablet (100 mg total) by mouth daily. 30 tablet 0  . nebivolol (BYSTOLIC) 5 MG tablet Take 1 tablet (5 mg total) by mouth daily. 90 tablet 1  . verapamil (CALAN-SR) 240 MG CR tablet TAKE 1 TABLET BY MOUTH DAILY 30 tablet 0  . SUPREP BOWEL PREP SOLN Take 1 kit by mouth once. 354 mL 0   No facility-administered medications prior to visit.    Allergies:  Allergies  Allergen Reactions  . Lisinopril Cough  . Simvastatin Other (See Comments)    Mylagias    Social History   Social History  . Marital Status: Widowed    Spouse Name: N/A  . Number of Children: N/A  . Years of Education: N/A   Occupational History  . Not on file.   Social History Main Topics  . Smoking status: Former Smoker    Types: Cigars    Quit date: 04/18/1997  . Smokeless tobacco: Never Used  . Alcohol Use: 10.8 oz/week    18 Cans of beer per week  . Drug Use: No  . Sexual Activity: Not on file   Other Topics Concern  . Not on file   Social History Narrative   Wife passed away--secondary to acute MI   Pt works--binding books   Has son and grandchildren nearby    Family History  Problem Relation Age of Onset  . Gout Maternal Uncle   . Gout Maternal Grandfather   . 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  Colon cancer Neg Hx     Physical Exam: Blood pressure 154/90, pulse 60, temperature 98.2 F (36.8 C), temperature source Oral, resp. rate 18, weight 227 lb (102.967 kg).  General: Well developed, well nourished, WM. Appears in no acute distress. Neck: Supple. Trachea midline. No thyromegaly. Full ROM. No lymphadenopathy. No carotid bruit. Lungs: Clear to auscultation bilaterally without wheezes, rales, or rhonchi. Breathing is of normal effort and unlabored. Cardiovascular: RRR with S1 S2. No murmurs, rubs, or gallops. Distal pulses 2+ symmetrically. No carotid or abdominal bruits. Abdomen: Soft, non-tender, non-distended with normoactive bowel sounds. No hepatosplenomegaly or masses. No rebound/guarding. No CVA tenderness. No hernias. Musculoskeletal: Full range of motion and 5/5 strength throughout. Skin: Warm and moist without erythema, ecchymosis, wounds, or rash. Neuro: A+Ox3. CN II-XII grossly intact. Moves all extremities spontaneously.  Normal gait.  Psych:  Responds to questions appropriately with a normal affect.   Assessment/Plan:  59 y.o. y/o  white male here for     Essential hypertension Is getting good readings at home. Blood pressures controlled. Continue current medications.  History of Hypertriglyceridemia: Recently came and had lipid panel performed. I reviewed these results with him today. He had stopped the fenofibrate about one year ago. Told him that he can stop stay off of this and I have removed this off of the medicine list today. He states that in the past he smoked a lot and drank alcohol and had a poor diet. Says that his diet has changed significantly and he now eats a lot of fish and a lot of vegetables. Also has quit smoking. Discussed that with these lifestyle changes, his lipid panel is now good and he does not require any medication for this.   F/U OV 6 months or sooner if needed.     THE FOLLOWING IS COPIED FROM HIS CPE 02/25/2015:  -1. Visit  for preventive health examination  A. Screening Labs: Our office lost power at the time of his visit. He just recently had FLP/LFT. Therefore, do not need to do FLP with this set of labs.  He will return for lab work.  - BASIC METABOLIC PANEL WITH GFR; Future - CBC with Differential/Platelet; Future - TSH; Future - Vit D  25 hydroxy (rtn osteoporosis monitoring); Future - PSA; Future  B. Screening For Prostate Cancer: Our office lost power at the time of his visit.  He will return for lab work.  - PSA; Future  C. Screening For Colorectal Cancer:  He reports that he had a colonoscopy at age 47. Says that he was to follow-up at age 89. Says that at that time he was traveling and missed that colonoscopy and never had it done. Says that the prior colonoscopy was performed in [place]. Showed polyps. He is agreeable for me to place the order for referral to GI for screening colonoscopy at this point. - Ambulatory referral to Gastroenterology At his visit with me 03/26/15 he says that he has his colonoscopy scheduled for this upcoming Wednesday.  D. Immunizations: Influenza vaccine--- given here 02/27/15 T dap------------------ given here 02/27/2015 He has no indication to require a pneumonia vaccine until age 33. Regarding the Zostavax he needs to contact his insurance to find out the insurance coverage and what his cost would be and decide whether he wants to receive this.   2. Screening for colorectal cancer He reports that he had a colonoscopy at age 20. Says that he was to follow-up at age 68. Says that at that time he was traveling and missed that colonoscopy and never had it done. Says that the prior colonoscopy was performed in [place]. Showed polyps. He is agreeable for me to place the order for referral to GI for screening colonoscopy at this point. - Ambulatory referral to Gastroenterology  3. Screening for prostate cancer Our office lost power at the time of his visit.  He  will return for lab work.  - PSA; Future   Signed:   [address] [place], [place]  03/26/2015 4:36 PM

## 2015-04-01 ENCOUNTER — Ambulatory Visit (AMBULATORY_SURGERY_CENTER): Payer: BLUE CROSS/BLUE SHIELD | Admitting: Internal Medicine

## 2015-04-01 ENCOUNTER — Encounter: Payer: Self-pay | Admitting: Internal Medicine

## 2015-04-01 VITALS — BP 115/73 | HR 51 | Temp 98.1°F | Resp 18 | Ht 73.0 in | Wt 227.0 lb

## 2015-04-01 DIAGNOSIS — D122 Benign neoplasm of ascending colon: Secondary | ICD-10-CM | POA: Diagnosis not present

## 2015-04-01 DIAGNOSIS — D120 Benign neoplasm of cecum: Secondary | ICD-10-CM | POA: Diagnosis not present

## 2015-04-01 DIAGNOSIS — D123 Benign neoplasm of transverse colon: Secondary | ICD-10-CM | POA: Diagnosis not present

## 2015-04-01 DIAGNOSIS — D128 Benign neoplasm of rectum: Secondary | ICD-10-CM

## 2015-04-01 DIAGNOSIS — Z860100 Personal history of colon polyps, unspecified: Secondary | ICD-10-CM

## 2015-04-01 DIAGNOSIS — K621 Rectal polyp: Secondary | ICD-10-CM | POA: Diagnosis not present

## 2015-04-01 HISTORY — PX: COLONOSCOPY: SHX174

## 2015-04-01 MED ORDER — SODIUM CHLORIDE 0.9 % IV SOLN
500.0000 mL | INTRAVENOUS | Status: DC
Start: 2015-04-01 — End: 2015-04-01

## 2015-04-01 NOTE — Progress Notes (Signed)
Report to PACU, RN, vss, BBS= Clear.  

## 2015-04-01 NOTE — Progress Notes (Signed)
Called to room to assist during endoscopic procedure.  Patient ID and intended procedure confirmed with present staff. Received instructions for my participation in the procedure from the performing physician.  

## 2015-04-01 NOTE — Patient Instructions (Signed)
Discharge instructions given. Handout on polyps. Resume previous medications. YOU HAD AN ENDOSCOPIC PROCEDURE TODAY AT THE [facility]:   Refer to the procedure report that was given to you for any specific questions about what was found during the examination.  If the procedure report does not answer your questions, please call your gastroenterologist to clarify.  If you requested that your care partner not be given the details of your procedure findings, then the procedure report has been included in a sealed envelope for you to review at your convenience later.  YOU SHOULD EXPECT: Some feelings of bloating in the abdomen. Passage of more gas than usual.  Walking can help get rid of the air that was put into your GI tract during the procedure and reduce the bloating. If you had a lower endoscopy (such as a colonoscopy or flexible sigmoidoscopy) you may notice spotting of blood in your stool or on the toilet paper. If you underwent a bowel prep for your procedure, you may not have a normal bowel movement for a few days.  Please Note:  You might notice some irritation and congestion in your nose or some drainage.  This is from the oxygen used during your procedure.  There is no need for concern and it should clear up in a day or so.  SYMPTOMS TO REPORT IMMEDIATELY:   Following lower endoscopy (colonoscopy or flexible sigmoidoscopy):  Excessive amounts of blood in the stool  Significant tenderness or worsening of abdominal pains  Swelling of the abdomen that is new, acute  Fever of 100F or higher   For urgent or emergent issues, a gastroenterologist can be reached at any hour by calling [phone].   DIET: Your first meal following the procedure should be a small meal and then it is ok to progress to your normal diet. Heavy or fried foods are harder to digest and may make you feel nauseous or bloated.  Likewise, meals heavy in dairy and vegetables can increase bloating.  Drink  plenty of fluids but you should avoid alcoholic beverages for 24 hours.  ACTIVITY:  You should plan to take it easy for the rest of today and you should NOT DRIVE or use heavy machinery until tomorrow (because of the sedation medicines used during the test).    FOLLOW UP: Our staff will call the number listed on your records the next business day following your procedure to check on you and address any questions or concerns that you may have regarding the information given to you following your procedure. If we do not reach you, we will leave a message.  However, if you are feeling well and you are not experiencing any problems, there is no need to return our call.  We will assume that you have returned to your regular daily activities without incident.  If any biopsies were taken you will be contacted by phone or by letter within the next 1-3 weeks.  Please call us at [phone] if you have not heard about the biopsies in 3 weeks.    SIGNATURES/CONFIDENTIALITY: You and/or your care partner have signed paperwork which will be entered into your electronic medical record.  These signatures attest to the fact that that the information above on your After Visit Summary has been reviewed and is understood.  Full responsibility of the confidentiality of this discharge information lies with you and/or your care-partner. 

## 2015-04-02 ENCOUNTER — Telehealth: Payer: Self-pay | Admitting: *Deleted

## 2015-04-02 NOTE — Telephone Encounter (Signed)
  Follow up Call-  Call back number 04/01/2015  Post procedure Call Back phone  # [phone]  Permission to leave phone message Yes    [facility]

## 2015-04-02 NOTE — Op Note (Signed)
[place]  [address] [place], [address]   COLONOSCOPY PROCEDURE REPORT  PATIENT: Frederick, Hicks  MR#: [id number] BIRTHDATE: 1955-04-26 , 59  yrs. old GENDER: male ENDOSCOPIST: Eustace Quail, MD REFERRED [id number] Ardath Sax, PA-C PROCEDURE DATE:  04/01/2015 PROCEDURE:   Colonoscopy, screening and Colonoscopy with snare polypectomy x 5 First Screening Colonoscopy - Avg.  risk and is 50 yrs.  old or older - No.  Prior Negative Screening - Now for repeat screening. N/A  History of Adenoma - Now for follow-up colonoscopy & has been > or = to 3 yrs.  N/A  Polyps removed today? Yes ASA CLASS:   Class II INDICATIONS:Screening for colonic neoplasia and Colorectal Neoplasm Risk Assessment for this procedure is average risk. NOTE: The patient reports index colonoscopy 10 years ago in [place]. He reports 2 polyps and was requested to have follow-up in 5 years. No further details. MEDICATIONS: Monitored anesthesia care and Propofol 300 mg IV  DESCRIPTION OF PROCEDURE:   After the risks benefits and alternatives of the procedure were thoroughly explained, informed consent was obtained.  The digital rectal exam revealed no abnormalities of the rectum.   The [id number]  endoscope was introduced through the anus and advanced to the cecum, which was identified by both the appendix and ileocecal valve. No adverse events experienced.   The quality of the prep was good.  (Suprep was used)  The instrument was then slowly withdrawn as the colon was fully examined. Estimated blood loss is zero unless otherwise noted in this procedure report.  COLON FINDINGS: Five polyps ranging between 3-59mm in size were found in the ascending colon, at the cecum, in the rectum, and transverse colon.  A polypectomy was performed with a cold snare.  The resection was complete, the polyp tissue was completely retrieved and sent to histology.   The examination was otherwise  normal. Retroflexed views revealed internal hemorrhoids. The time to cecum = 2.5 Withdrawal time = 15.4   The scope was withdrawn and the procedure completed. COMPLICATIONS: There were no immediate complications.  ENDOSCOPIC IMPRESSION: 1.   Five polyps were found in the colon; polypectomy was performed with a cold snare 2.   The examination was otherwise normal  RECOMMENDATIONS: 1. Repeat Colonoscopy in 3 years.  eSigned:  Eustace Quail, MD 04/01/2015 2:18 PM   cc: The Patient and Karis Juba PA

## 2015-04-07 ENCOUNTER — Encounter: Payer: Self-pay | Admitting: Internal Medicine

## 2015-04-13 ENCOUNTER — Other Ambulatory Visit: Payer: Self-pay | Admitting: Physician Assistant

## 2015-04-14 NOTE — Telephone Encounter (Signed)
Medication refilled per protocol. 

## 2015-05-20 ENCOUNTER — Other Ambulatory Visit: Payer: Self-pay | Admitting: Physician Assistant

## 2015-05-20 NOTE — Telephone Encounter (Signed)
Medication refilled per protocol. 

## 2015-09-23 ENCOUNTER — Other Ambulatory Visit: Payer: Self-pay | Admitting: Physician Assistant

## 2015-09-23 NOTE — Telephone Encounter (Signed)
Refill appropriate and filled per protocol. 

## 2015-10-07 ENCOUNTER — Other Ambulatory Visit: Payer: Self-pay | Admitting: Physician Assistant

## 2015-10-07 ENCOUNTER — Encounter: Payer: Self-pay | Admitting: Family Medicine

## 2015-10-07 NOTE — Telephone Encounter (Signed)
Medication refill for one time only.  Patient needs to be seen.  Letter sent for patient to call and schedule 

## 2015-11-02 ENCOUNTER — Ambulatory Visit (INDEPENDENT_AMBULATORY_CARE_PROVIDER_SITE_OTHER): Payer: BLUE CROSS/BLUE SHIELD | Admitting: Physician Assistant

## 2015-11-02 ENCOUNTER — Encounter: Payer: Self-pay | Admitting: Physician Assistant

## 2015-11-02 VITALS — BP 130/88 | Temp 98.1°F | Wt 228.0 lb

## 2015-11-02 DIAGNOSIS — T63441A Toxic effect of venom of bees, accidental (unintentional), initial encounter: Secondary | ICD-10-CM | POA: Diagnosis not present

## 2015-11-02 DIAGNOSIS — E559 Vitamin D deficiency, unspecified: Secondary | ICD-10-CM | POA: Diagnosis not present

## 2015-11-02 DIAGNOSIS — I10 Essential (primary) hypertension: Secondary | ICD-10-CM

## 2015-11-02 MED ORDER — PREDNISONE 20 MG PO TABS: ORAL_TABLET | ORAL | Status: DC

## 2015-11-02 NOTE — Progress Notes (Signed)
Patient ID: Frederick Hicks MRN: [id number], DOB: June 01, 1955 59 y.o. Date of Encounter: 11/02/2015, 4:36 PM    Chief Complaint: F/U HTN., Bee Sting  HPI: 60 y.o. y/o white male here for f/u HTN.   He had been seeing me in the past but then his last visit with me was 08/23/2012 until his recent visits outlined below. At his visit 08/23/12 he was on medication for hypertension--- was taking HCTZ and verapamil. At that time for hyperlipidemia he was taking fenofibrate.  At his visit with me 08/23/12 he reported that he had quit the fenofibrate about one year prior. At that visit he was still taking the HCTZ and verapamil daily.  At follow-up visit with me 02/11/15. At that time blood pressure was elevated and losartan 50 mg was added. That visit he was agreeable to return in 2 weeks to follow-up blood pressure and also for complete physical exam. Checked a lipid panel on 02/11/15. Said that he could stay off of the fenofibrate.  He returned for complete physical exam 02/25/15. He was taking that losartan 50 mg daily in addition to his other medicines. At that visit his blood pressure was still elevated so the Cozaar was increased from 50 mg to 100 mg.  He returned for nurse blood pressure check on 03/04/15. Blood pressure was 146/90. That time we told him to add Bystolic 5 mg daily. So at that time had reviewed lab results and his vitamin D was low and told him to add over-the-counter vitamin D 4000 units daily.  03/26/2015: Today he states that he is taking the Bystolic 5 mg daily. States that he is taking over-the-counter vitamin D 4000 units daily.  He states he has a new blood pressure cuff and has been checking his blood pressure at home. sAYs that he has been getting good readings there and much lower than the reading the nurse had today. Says last night it was 117/68. His other readings have been like 127 over 70s and in that range.  11/02/2015: He came in for office visit today because of  bee sting. Says that this bee sting on his left forearm occurred at about 3:00 PM yesterday (25 hours prior to this office visit). Says that today the area of redness and swelling has actually increased today---and it is quite uncomfortable-- so felt that he should come in for visit. Today around noon he took 2 Benadryl but has seen no change. Has used no other treatment. Has had no shortness of breath or tightening in the throat.  He makes mention that he has his routine office visit scheduled for next week. Discussed that we can go ahead and cover that today and he is agreeable.  He is taking all of his blood pressure medications as directed. Having no lightheadedness or other adverse effects.  He is taking vitamin D 4000 units daily.   Review of Systems: Consitutional: No fever, chills, fatigue, night sweats, lymphadenopathy, or weight changes. Eyes: No visual changes, eye redness, or discharge. ENT/Mouth: Ears: No otalgia, tinnitus, hearing loss, discharge. Nose: No congestion, rhinorrhea, sinus pain, or epistaxis. Throat: No sore throat, post nasal drip, or teeth pain. Cardiovascular: No CP, palpitations, diaphoresis, DOE, edema, orthopnea, PND. Respiratory: No cough, hemoptysis, SOB, or wheezing. Gastrointestinal: No anorexia, dysphagia, reflux, pain, nausea, vomiting, hematemesis, diarrhea, constipation, BRBPR, or melena. Genitourinary: No dysuria, frequency, urgency, hematuria, incontinence, nocturia, decreased urinary stream, discharge, impotence, or testicular pain/masses. Musculoskeletal: No decreased ROM, myalgias, stiffness, joint swelling, or weakness.  Skin: No rash, erythema, lesion changes, pain, warmth, jaundice, or pruritis. Neurological: No headache, dizziness, syncope, seizures, tremors, memory loss, coordination problems, or paresthesias. Psychological: No anxiety, depression, hallucinations, SI/HI. Endocrine: No fatigue, polydipsia, polyphagia, polyuria, or known  diabetes. All other systems were reviewed and are otherwise negative.  Past Medical History  Diagnosis Date  . Hypertension   . Hiatal hernia     He reports that in his 8s he smoked a lot. Says that he smoked menthol cigarettes which he has been told are the very worst for you. Says he was smoking 2 packs per day. Says that he ended up having an endoscopy and colonoscopy and was told he had significant esophagitis and hiatal hernia. He has quit smoking. He does drink 3 or 4 beers each day. Says that he has never been able to drink more than 5 beers per day so has never had heavier alcohol use. At visit 02/25/15 discussed decreasing his alcohol and he says that he can definitely decrease that/stop it. Says that regarding the hiatal hernia he got in the habit of sleeping propped up on pillows so he still does this. However he says that he does not have any reflux symptoms and he avoids [place] foods and spicy foods like that.  Past Surgical History  Procedure Laterality Date  . Tonsillectomy    . Ankle fracture surgery      left, pinning    Home Meds:  Outpatient Prescriptions Prior to Visit  Medication Sig Dispense Refill  . aspirin 81 MG tablet Take 81 mg by mouth daily.    Marland Kitchen BYSTOLIC 5 MG tablet TAKE 1 TABLET EVERY DAY 90 tablet 0  . Cholecalciferol (SM VITAMIN D3) 4000 UNITS CAPS Take 1 capsule by mouth daily.    . hydrochlorothiazide (HYDRODIURIL) 25 MG tablet TAKE 1 TABLET BY MOUTH EVERY DAY 90 tablet 1  . losartan (COZAAR) 100 MG tablet TAKE 1 TABLET BY MOUTH EVERY DAY 90 tablet 0  . verapamil (CALAN-SR) 240 MG CR tablet TAKE 1 TABLET BY MOUTH DAILY 90 tablet 1  . SUPREP BOWEL PREP SOLN Take 1 kit by mouth once. 354 mL 0   No facility-administered medications prior to visit.    Allergies:  Allergies  Allergen Reactions  . Lisinopril Cough  . Simvastatin Other (See Comments)    Mylagias    Social History   Social History  . Marital Status: Widowed    Spouse Name: N/A    . Number of Children: N/A  . Years of Education: N/A   Occupational History  . Not on file.   Social History Main Topics  . Smoking status: Former Smoker    Types: Cigars    Quit date: 04/18/1997  . Smokeless tobacco: Never Used  . Alcohol Use: 10.8 oz/week    18 Cans of beer per week  . Drug Use: No  . Sexual Activity: Not on file   Other Topics Concern  . Not on file   Social History Narrative   Wife passed away--secondary to acute MI   Pt works--binding books   Has son and grandchildren nearby    Family History  Problem Relation Age of Onset  . Gout Maternal Uncle   . Gout Maternal Grandfather   . 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 Colon cancer Neg Hx     Physical Exam: Blood pressure 130/88, temperature 98.1 F (36.7 C), weight 228 lb (103.42 kg).  General: Well  developed, well nourished, WM. Appears in no acute distress. Neck: Supple. Trachea midline. No thyromegaly. Full ROM. No lymphadenopathy. No carotid bruit. Lungs: Clear to auscultation bilaterally without wheezes, rales, or rhonchi. Breathing is of normal effort and unlabored. Cardiovascular: RRR with S1 S2. No murmurs, rubs, or gallops. Distal pulses 2+ symmetrically. No carotid or abdominal bruits. Abdomen: Soft, non-tender, non-distended with normoactive bowel sounds. No hepatosplenomegaly or masses. No rebound/guarding. No CVA tenderness. No hernias. Musculoskeletal: Full range of motion and 5/5 strength throughout. Skin: Left Forearm: Dorsal Aspect: There is diffuse erythema and mild swelling . This does not extend to the ventral surface.  Neuro: A+Ox3. CN II-XII grossly intact. Moves all extremities spontaneously.  Normal gait.  Psych:  Responds to questions appropriately with a normal affect.   Assessment/Plan:  60 y.o. y/o  white male here for   1. Bee sting, accidental or unintentional, initial encounter - predniSONE (DELTASONE) 20 MG tablet; Take 3  daily for 2 days, then 2 daily for 2 days, then 1 daily for 2 days.  Dispense: 12 tablet; Refill: 0   Vitamin D deficiency He is taking vitamin D supplement 4000 units daily. Recheck vitamin D level now. - VITAMIN D 25 Hydroxy (Vit-D Deficiency, Fractures)    Essential hypertension Is getting good readings at home. Blood pressures controlled. Continue current medications.  History of Hypertriglyceridemia: Recently came and had lipid panel performed. I reviewed these results with him today. He had stopped the fenofibrate about one year ago. Told him that he can stop stay off of this and I have removed this off of the medicine list today. He states that in the past he smoked a lot and drank alcohol and had a poor diet. Says that his diet has changed significantly and he now eats a lot of fish and a lot of vegetables. Also has quit smoking. Discussed that with these lifestyle changes, his lipid panel is now good and he does not require any medication for this.   F/U OV 6 months or sooner if needed.     THE FOLLOWING IS COPIED FROM HIS CPE 02/25/2015:  -1. Visit for preventive health examination  A. Screening Labs: Our office lost power at the time of his visit. He just recently had FLP/LFT. Therefore, do not need to do FLP with this set of labs.  He will return for lab work.  - BASIC METABOLIC PANEL WITH GFR; Future - CBC with Differential/Platelet; Future - TSH; Future - Vit D  25 hydroxy (rtn osteoporosis monitoring); Future - PSA; Future  B. Screening For Prostate Cancer: Our office lost power at the time of his visit.  He will return for lab work.  - PSA; Future  C. Screening For Colorectal Cancer:  He reports that he had a colonoscopy at age 7. Says that he was to follow-up at age 48. Says that at that time he was traveling and missed that colonoscopy and never had it done. Says that the prior colonoscopy was performed in [place]. Showed polyps. He is agreeable for me to  place the order for referral to GI for screening colonoscopy at this point. - Ambulatory referral to Gastroenterology At his visit with me 03/26/15 he says that he has his colonoscopy scheduled for this upcoming Wednesday.  D. Immunizations: Influenza vaccine--- given here 02/27/15 T dap------------------ given here 02/27/2015 He has no indication to require a pneumonia vaccine until age 54. Regarding the Zostavax he needs to contact his insurance to find out the insurance coverage  and what his cost would be and decide whether he wants to receive this.   Signed:   [address] [place], [place]  11/02/2015 4:36 PM

## 2015-11-03 LAB — BASIC METABOLIC PANEL WITH GFR
BUN: 16 mg/dL (ref 7–25)
CHLORIDE: 104 mmol/L (ref 98–110)
CO2: 25 mmol/L (ref 20–31)
Calcium: 9.8 mg/dL (ref 8.6–10.3)
Creat: 1.46 mg/dL — ABNORMAL HIGH (ref 0.70–1.25)
GFR, Est African American: 59 mL/min — ABNORMAL LOW (ref >=60)
GFR, Est Non African American: 51 mL/min — ABNORMAL LOW (ref >=60)
GLUCOSE: 91 mg/dL (ref 70–99)
POTASSIUM: 4.4 mmol/L (ref 3.5–5.3)
Sodium: 139 mmol/L (ref 135–146)

## 2015-11-03 LAB — VITAMIN D 25 HYDROXY (VIT D DEFICIENCY, FRACTURES): Vit D, 25-Hydroxy: 32 ng/mL (ref 30–100)

## 2015-11-04 ENCOUNTER — Ambulatory Visit: Payer: BLUE CROSS/BLUE SHIELD | Admitting: Physician Assistant

## 2015-12-21 ENCOUNTER — Other Ambulatory Visit: Payer: Self-pay | Admitting: Physician Assistant

## 2016-01-19 ENCOUNTER — Other Ambulatory Visit: Payer: Self-pay | Admitting: Physician Assistant

## 2016-03-08 DIAGNOSIS — H52223 Regular astigmatism, bilateral: Secondary | ICD-10-CM | POA: Diagnosis not present

## 2016-03-08 DIAGNOSIS — H5203 Hypermetropia, bilateral: Secondary | ICD-10-CM | POA: Diagnosis not present

## 2016-03-08 DIAGNOSIS — H524 Presbyopia: Secondary | ICD-10-CM | POA: Diagnosis not present

## 2016-03-28 ENCOUNTER — Other Ambulatory Visit: Payer: Self-pay | Admitting: Physician Assistant

## 2016-04-10 ENCOUNTER — Other Ambulatory Visit: Payer: Self-pay | Admitting: Physician Assistant

## 2016-04-14 NOTE — Telephone Encounter (Signed)
rx filled per protocol  

## 2016-05-05 ENCOUNTER — Ambulatory Visit: Payer: BLUE CROSS/BLUE SHIELD | Admitting: Physician Assistant

## 2016-05-09 ENCOUNTER — Ambulatory Visit (INDEPENDENT_AMBULATORY_CARE_PROVIDER_SITE_OTHER): Payer: BLUE CROSS/BLUE SHIELD | Admitting: Physician Assistant

## 2016-05-09 VITALS — BP 100/60 | HR 51 | Temp 98.0°F | Resp 16 | Wt 219.2 lb

## 2016-05-09 DIAGNOSIS — Z125 Encounter for screening for malignant neoplasm of prostate: Secondary | ICD-10-CM | POA: Diagnosis not present

## 2016-05-09 DIAGNOSIS — I10 Essential (primary) hypertension: Secondary | ICD-10-CM

## 2016-05-09 DIAGNOSIS — E785 Hyperlipidemia, unspecified: Secondary | ICD-10-CM | POA: Diagnosis not present

## 2016-05-09 DIAGNOSIS — E559 Vitamin D deficiency, unspecified: Secondary | ICD-10-CM | POA: Diagnosis not present

## 2016-05-09 DIAGNOSIS — Z23 Encounter for immunization: Secondary | ICD-10-CM | POA: Diagnosis not present

## 2016-05-09 NOTE — Addendum Note (Signed)
Addended by: Vonna Kotyk A on: 05/09/2016 05:07 PM   Modules accepted: Orders

## 2016-05-09 NOTE — Progress Notes (Signed)
Patient ID: Frederick Hicks MRN: [id number], DOB: 22-Nov-1955 61 y.o. Date of Encounter: 05/09/2016, 2:19 PM    Chief Complaint: F/U HTN.  HPI: 61 y.o. y/o white male here for f/u HTN.   He had been seeing me in the past but then his last visit with me was 08/23/2012 until his recent visits outlined below. At his visit 08/23/12 he was on medication for hypertension--- was taking HCTZ and verapamil. At that time for hyperlipidemia he was taking fenofibrate.  At his visit with me 08/23/12 he reported that he had quit the fenofibrate about one year prior. At that visit he was still taking the HCTZ and verapamil daily.  At follow-up visit with me 02/11/15. At that time blood pressure was elevated and losartan 50 mg was added. That visit he was agreeable to return in 2 weeks to follow-up blood pressure and also for complete physical exam. Checked a lipid panel on 02/11/15. Said that he could stay off of the fenofibrate.  He returned for complete physical exam 02/25/15. He was taking that losartan 50 mg daily in addition to his other medicines. At that visit his blood pressure was still elevated so the Cozaar was increased from 50 mg to 100 mg.  He returned for nurse blood pressure check on 03/04/15. Blood pressure was 146/90. That time we told him to add Bystolic 5 mg daily. So at that time had reviewed lab results and his vitamin D was low and told him to add over-the-counter vitamin D 4000 units daily.  Today he states that he is taking the Bystolic 5 mg daily. States that he is taking over-the-counter vitamin D 4000 units daily.  He states he has a new blood pressure cuff and has been checking his blood pressure at home. Says that he has been getting good readings there and much lower than the reading the nurse had today. Says last night it was 117/68. His other readings have been like 127 over 70s and in that range.    05/09/2016: Today he reports that when the nurse weighed him he had lost some  weight. Is that he has cut back his alcohol intake and thinks that has probably been a big factor. Says that he is feeling better since sees had this weight loss. Says that he is taking all blood pressure medications as directed and is having no adverse effects. No lightheadedness. Checks his blood pressure at home occasionally. Says that at times it will run higher than it is today but then he'll sit and rest and that will sometimes read lower like it is today. He states that he is taking vitamin D supplement as directed. He has no specific complaints or concerns to address today.    Review of Systems: Consitutional: No fever, chills, fatigue, night sweats, lymphadenopathy, or weight changes. Eyes: No visual changes, eye redness, or discharge. ENT/Mouth: Ears: No otalgia, tinnitus, hearing loss, discharge. Nose: No congestion, rhinorrhea, sinus pain, or epistaxis. Throat: No sore throat, post nasal drip, or teeth pain. Cardiovascular: No CP, palpitations, diaphoresis, DOE, edema, orthopnea, PND. Respiratory: No cough, hemoptysis, SOB, or wheezing. Gastrointestinal: No anorexia, dysphagia, reflux, pain, nausea, vomiting, hematemesis, diarrhea, constipation, BRBPR, or melena. Genitourinary: No dysuria, frequency, urgency, hematuria, incontinence, nocturia, decreased urinary stream, discharge, impotence, or testicular pain/masses. Musculoskeletal: No decreased ROM, myalgias, stiffness, joint swelling, or weakness. Skin: No rash, erythema, lesion changes, pain, warmth, jaundice, or pruritis. Neurological: No headache, dizziness, syncope, seizures, tremors, memory loss, coordination problems, or paresthesias. Psychological:  No anxiety, depression, hallucinations, SI/HI. Endocrine: No fatigue, polydipsia, polyphagia, polyuria, or known diabetes. All other systems were reviewed and are otherwise negative.  Past Medical History:  Diagnosis Date  . Hiatal hernia   . Hypertension     He reports  that in his 61s he smoked a lot. Says that he smoked menthol cigarettes which he has been told are the very worst for you. Says he was smoking 2 packs per day. Says that he ended up having an endoscopy and colonoscopy and was told he had significant esophagitis and hiatal hernia. He has quit smoking. He does drink 3 or 4 beers each day. Says that he has never been able to drink more than 5 beers per day so has never had heavier alcohol use. At visit 02/25/15 discussed decreasing his alcohol and he says that he can definitely decrease that/stop it. Says that regarding the hiatal hernia he got in the habit of sleeping propped up on pillows so he still does this. However he says that he does not have any reflux symptoms and he avoids [place] foods and spicy foods like that.  Past Surgical History:  Procedure Laterality Date  . ANKLE FRACTURE SURGERY     left, pinning  . TONSILLECTOMY      Home Meds:  Outpatient Medications Prior to Visit  Medication Sig Dispense Refill  . aspirin 81 MG tablet Take 81 mg by mouth daily.    Marland Kitchen BYSTOLIC 5 MG tablet TAKE 1 TABLET BY MOUTH EVERY DAY 90 tablet 0  . Cholecalciferol (SM VITAMIN D3) 4000 UNITS CAPS Take 1 capsule by mouth daily.    . hydrochlorothiazide (HYDRODIURIL) 25 MG tablet TAKE 1 TABLET BY MOUTH EVERY DAY 90 tablet 1  . losartan (COZAAR) 100 MG tablet TAKE 1 TABLET BY MOUTH EVERY DAY 60 tablet 0  . verapamil (CALAN-SR) 240 MG CR tablet TAKE 1 TABLET BY MOUTH DAILY 90 tablet 1  . predniSONE (DELTASONE) 20 MG tablet Take 3 daily for 2 days, then 2 daily for 2 days, then 1 daily for 2 days. (Patient not taking: Reported on 05/09/2016) 12 tablet 0   No facility-administered medications prior to visit.     Allergies:  Allergies  Allergen Reactions  . Lisinopril Cough  . Simvastatin Other (See Comments)    Mylagias    Social History   Social History  . Marital status: Widowed    Spouse name: N/A  . Number of children: N/A  . Years of  education: N/A   Occupational History  . Not on file.   Social History Main Topics  . Smoking status: Former Smoker    Types: Cigars    Quit date: 04/18/1997  . Smokeless tobacco: Never Used  . Alcohol use 10.8 oz/week    18 Cans of beer per week  . Drug use: No  . Sexual activity: Not on file   Other Topics Concern  . Not on file   Social History Narrative   Wife passed away--secondary to acute MI   Pt works--binding books   Has son and grandchildren nearby    Family History  Problem Relation Age of Onset  . Gout Maternal Uncle   . Gout Maternal Grandfather   . 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 Colon cancer Neg Hx     Physical Exam: Blood pressure 100/60, 61--pulse , temperature 98 F (36.7 C), temperature source Oral, resp. rate 16, weight 219 lb 3.2 oz (  99.4 kg), SpO2 99 %.  General: Well developed, well nourished, WM. Appears in no acute distress. Neck: Supple. Trachea midline. No thyromegaly. Full ROM. No lymphadenopathy. No carotid bruit. Lungs: Clear to auscultation bilaterally without wheezes, rales, or rhonchi. Breathing is of normal effort and unlabored. Cardiovascular: RRR with S1 S2. No murmurs, rubs, or gallops. Distal pulses 2+ symmetrically. No carotid or abdominal bruits. Abdomen: Soft, non-tender, non-distended with normoactive bowel sounds. No hepatosplenomegaly or masses. No rebound/guarding. No CVA tenderness. No hernias. Musculoskeletal: Full range of motion and 5/5 strength throughout. Skin: Warm and moist without erythema, ecchymosis, wounds, or rash. Neuro: A+Ox3. CN II-XII grossly intact. Moves all extremities spontaneously.  Normal gait.  Psych:  Responds to questions appropriately with a normal affect.   Assessment/Plan:  61 y.o. y/o  white male here for     Essential hypertension Is getting good readings at home. Blood pressures controlled. Continue current medications. Check lab to monitor. -  BASIC METABOLIC PANEL WITH GFR   Vitamin D deficiency States that he is taking vitamin D supplement as directed. Will recheck vitamin D level. - VITAMIN D 25 Hydroxy (Vit-D Deficiency, Fractures)  Prostate cancer screening It has been > 12 months since last PSA check so will check this with labs today. - PSA  Dyslipidemia History of Hypertriglyceridemia: Recently came and had lipid panel performed. I reviewed these results with him today. He had stopped the fenofibrate about one year ago. Told him that he can stop stay off of this and I have removed this off of the medicine list today. He states that in the past he smoked a lot and drank alcohol and had a poor diet. Says that his diet has changed significantly and he now eats a lot of fish and a lot of vegetables. Also has quit smoking. Discussed that with these lifestyle changes, his lipid panel is now good and he does not require any medication for this.   F/U OV 6 months or sooner if needed.     THE FOLLOWING IS COPIED FROM HIS CPE 02/25/2015:  -1. Visit for preventive health examination  A. Screening Labs: Our office lost power at the time of his visit. He just recently had FLP/LFT. Therefore, do not need to do FLP with this set of labs.  He will return for lab work.  - BASIC METABOLIC PANEL WITH GFR; Future - CBC with Differential/Platelet; Future - TSH; Future - Vit D  25 hydroxy (rtn osteoporosis monitoring); Future - PSA; Future  B. Screening For Prostate Cancer: Our office lost power at the time of his visit.  He will return for lab work.  - PSA; Future  C. Screening For Colorectal Cancer:  He reports that he had a colonoscopy at age 75. Says that he was to follow-up at age 61. Says that at that time he was traveling and missed that colonoscopy and never had it done. Says that the prior colonoscopy was performed in [place]. Showed polyps. He is agreeable for me to place the order for referral to GI for screening  colonoscopy at this point. - Ambulatory referral to Gastroenterology At his visit with me 03/26/15 he says that he has his colonoscopy scheduled for this upcoming Wednesday.  D. Immunizations: Influenza vaccine--- given here 02/27/15 T dap------------------ given here 02/27/2015 He has no indication to require a pneumonia vaccine until age 60. Regarding the Zostavax he needs to contact his insurance to find out the insurance coverage and what his cost would be and decide  whether he wants to receive this.   2. Screening for colorectal cancer He reports that he had a colonoscopy at age 63. Says that he was to follow-up at age 54. Says that at that time he was traveling and missed that colonoscopy and never had it done. Says that the prior colonoscopy was performed in [place]. Showed polyps. He is agreeable for me to place the order for referral to GI for screening colonoscopy at this point. - Ambulatory referral to Gastroenterology  3. Screening for prostate cancer Our office lost power at the time of his visit.  He will return for lab work.  - PSA; Future   Signed:   [address] [place], [place]  05/09/2016 2:19 PM

## 2016-05-10 LAB — BASIC METABOLIC PANEL WITH GFR
BUN: 20 mg/dL (ref 7–25)
CO2: 25 mmol/L (ref 20–31)
Calcium: 9.9 mg/dL (ref 8.6–10.3)
Chloride: 105 mmol/L (ref 98–110)
Creat: 1.39 mg/dL — ABNORMAL HIGH (ref 0.70–1.25)
GFR, EST AFRICAN AMERICAN: 63 mL/min (ref >=60)
GFR, Est Non African American: 54 mL/min — ABNORMAL LOW (ref >=60)
GLUCOSE: 80 mg/dL (ref 70–99)
Potassium: 4.5 mmol/L (ref 3.5–5.3)
Sodium: 139 mmol/L (ref 135–146)

## 2016-05-10 LAB — VITAMIN D 25 HYDROXY (VIT D DEFICIENCY, FRACTURES): Vit D, 25-Hydroxy: 29 ng/mL — ABNORMAL LOW (ref 30–100)

## 2016-05-10 LAB — PSA: PSA: 1.1 ng/mL (ref <=4.0)

## 2016-05-11 ENCOUNTER — Other Ambulatory Visit: Payer: Self-pay

## 2016-05-11 ENCOUNTER — Telehealth: Payer: Self-pay | Admitting: Physician Assistant

## 2016-05-11 MED ORDER — VITAMIN D-3 125 MCG (5000 UT) PO TABS: 1.0000 | ORAL_TABLET | Freq: Every day | ORAL | Status: DC

## 2016-05-11 NOTE — Telephone Encounter (Signed)
Spoke with pt provided lab results

## 2016-05-11 NOTE — Telephone Encounter (Signed)
Pt calling back to get lab results  

## 2016-06-03 ENCOUNTER — Other Ambulatory Visit: Payer: Self-pay | Admitting: Physician Assistant

## 2016-06-09 ENCOUNTER — Other Ambulatory Visit: Payer: Self-pay

## 2016-06-09 MED ORDER — VERAPAMIL HCL ER 240 MG PO TBCR: 240.0000 mg | EXTENDED_RELEASE_TABLET | Freq: Every day | ORAL | 1 refills | Status: DC

## 2016-06-09 NOTE — Telephone Encounter (Signed)
Refill appropriate 

## 2016-07-15 ENCOUNTER — Other Ambulatory Visit: Payer: Self-pay | Admitting: Physician Assistant

## 2016-07-18 NOTE — Telephone Encounter (Signed)
Refill appropriate 

## 2016-07-29 ENCOUNTER — Ambulatory Visit (INDEPENDENT_AMBULATORY_CARE_PROVIDER_SITE_OTHER): Payer: BLUE CROSS/BLUE SHIELD | Admitting: Family Medicine

## 2016-07-29 ENCOUNTER — Ambulatory Visit
Admission: RE | Admit: 2016-07-29 | Discharge: 2016-07-29 | Disposition: A | Discharge status: Home / Self Care | Payer: BLUE CROSS/BLUE SHIELD | Source: Ambulatory Visit | Attending: Family Medicine | Admitting: Family Medicine

## 2016-07-29 ENCOUNTER — Encounter: Payer: Self-pay | Admitting: Family Medicine

## 2016-07-29 VITALS — BP 102/62 | HR 58 | Temp 97.9°F | Resp 14 | Ht 73.0 in | Wt 221.0 lb

## 2016-07-29 DIAGNOSIS — M25562 Pain in left knee: Secondary | ICD-10-CM

## 2016-07-29 DIAGNOSIS — M25462 Effusion, left knee: Principal | ICD-10-CM

## 2016-07-29 DIAGNOSIS — M25561 Pain in right knee: Secondary | ICD-10-CM | POA: Diagnosis not present

## 2016-07-29 DIAGNOSIS — M11262 Other chondrocalcinosis, left knee: Secondary | ICD-10-CM | POA: Diagnosis not present

## 2016-07-29 DIAGNOSIS — M25461 Effusion, right knee: Secondary | ICD-10-CM | POA: Diagnosis not present

## 2016-07-29 MED ORDER — DICLOFENAC SODIUM 75 MG PO TBEC: 75.0000 mg | DELAYED_RELEASE_TABLET | Freq: Two times a day (BID) | ORAL | 1 refills | Status: DC

## 2016-07-29 NOTE — Progress Notes (Signed)
   Subjective:    Patient ID: Frederick Hicks, male    DOB: 1955-10-17, 61 y.o.   MRN: [id number]  Patient presents for R Knee Edema (x1 week- states that he had swelling in L knee x3 days, then swelling moved to R knee with pain, and now L knee is beginning to hurt again)  Patient here with bilateral knee pain. About 2 weeks ago he was  having some pain and stiffness in his right knee. He took a trip to [place] and actually did okay when he returns he then had swelling into his knee. It went down a little bit and then a few days later he had pain and swelling into the left knee. That is now only sore but he now has persistent swelling back into the right knee. He states that this is happening in the past. I looked back in 2015 he had swelling in his knee as well as the right ankle uric acid was done there was no sign of gout. He spoke with his family members his mother and his brother have been diagnosed with rheumatoid arthritis. He denies any injury recently to the joints. He did have a cartilage tear in high school. He's never had any imaging done on his joints. He has been taking his regular aspirin and also using a knee brace for the right side   Review Of Systems:  GEN- denies fatigue, fever, weight loss,weakness, recent illness HEENT- denies eye drainage, change in vision, nasal discharge, CVS- denies chest pain, palpitations RESP- denies SOB, cough, wheeze ABD- denies N/V, change in stools, abd pain GU- denies dysuria, hematuria, dribbling, incontinence MSK-+ joint pain, muscle aches, injury Neuro- denies headache, dizziness, syncope, seizure activity       Objective:    BP 102/62   Pulse (!) 58   Temp 97.9 F (36.6 C) (Oral)   Resp 14   Ht 6\' 1"  (1.854 m)   Wt 221 lb (100.2 kg)   SpO2 98%   BMI 29.16 kg/m  GEN- NAD, alert and oriented x3 MSK- Right knee- effusion > L, TTP difficulsery, no warmth, no erythema, decreased flexion, pain with ROM, Left Knee- minimal effusion some  fullness on infeior  lateral and medial aspect of knee. liagments in tact, good ROM left knee Ankles- no effusion, FROM, Hips- Good ROM Ext- no pedal edema, neg homans        Assessment & Plan:      Problem List Items Addressed This Visit    None    Visit Diagnoses    Effusion of both knee joints    -  Primary   Concern for fleeting effusion and pain into multiple joints, family history of Rheumatoid as well. Xray both knees, check inflammatory markers, start Diclofenac Other possibility is gout as he is on HCTZ  Or progressive OA No sign of blood clot    Relevant Orders   Rheumatoid factor   Sedimentation Rate   C-reactive protein   Uric Acid   DG Knee Complete 4 Views Right   DG Knee Complete 4 Views Left   Acute pain of both knees       Relevant Orders   DG Knee Complete 4 Views Right   DG Knee Complete 4 Views Left      Note: This dictation was prepared with Dragon dictation along with smaller phrase technology. Any transcriptional errors that result from this process are unintentional.

## 2016-07-29 NOTE — Patient Instructions (Signed)
Take the diclofenac Ice knee/elevate legs Get the [facility]     [address] [address]  We will call with results  F/U pending results

## 2016-07-30 LAB — SEDIMENTATION RATE: Sed Rate: 4 mm/hr (ref 0–20)

## 2016-07-30 LAB — URIC ACID: Uric Acid, Serum: 5.3 mg/dL (ref 4.0–8.0)

## 2016-07-31 ENCOUNTER — Other Ambulatory Visit: Payer: Self-pay | Admitting: Physician Assistant

## 2016-08-01 LAB — RHEUMATOID FACTOR: Rhuematoid fact SerPl-aCnc: <14 IU/mL (ref <14)

## 2016-08-01 LAB — C-REACTIVE PROTEIN: CRP: 3.3 mg/L (ref <8.0)

## 2016-08-01 NOTE — Telephone Encounter (Signed)
Refill appropriate 

## 2016-08-03 ENCOUNTER — Other Ambulatory Visit: Payer: Self-pay | Admitting: Physician Assistant

## 2016-08-03 ENCOUNTER — Other Ambulatory Visit: Payer: Self-pay | Admitting: *Deleted

## 2016-08-03 DIAGNOSIS — M2550 Pain in unspecified joint: Secondary | ICD-10-CM

## 2016-08-03 DIAGNOSIS — M25561 Pain in right knee: Secondary | ICD-10-CM

## 2016-08-03 DIAGNOSIS — M25562 Pain in left knee: Principal | ICD-10-CM

## 2016-08-03 NOTE — Telephone Encounter (Signed)
Refill appropriate 

## 2016-08-09 DIAGNOSIS — S83241A Other tear of medial meniscus, current injury, right knee, initial encounter: Secondary | ICD-10-CM | POA: Diagnosis not present

## 2016-08-10 ENCOUNTER — Telehealth: Payer: Self-pay

## 2016-08-10 DIAGNOSIS — M25561 Pain in right knee: Secondary | ICD-10-CM | POA: Diagnosis not present

## 2016-08-10 NOTE — Telephone Encounter (Signed)
Patient called about requesting a disability placard sticker because he is having problems with his knee . Patient left a message and states he was told he may have a torn cartilage

## 2016-08-10 NOTE — Telephone Encounter (Signed)
I completed Form for Handicapped Parking -- Temporary for 6 months. Dr. Janeann Forehand Result Note mentioned him seeing Rheumatology-- Has he seen Rheumatology or Orthopedics? Who is he seeing?

## 2016-08-10 NOTE — Telephone Encounter (Signed)
Spoke with patient he states he saw Dr. Alfonso Ramus b/c he could not wait any longer on his referral, and they sent him for a CT scan. Patient states he will follow up with them after they get the results

## 2016-08-11 NOTE — Telephone Encounter (Signed)
ok 

## 2016-08-17 DIAGNOSIS — S83241D Other tear of medial meniscus, current injury, right knee, subsequent encounter: Secondary | ICD-10-CM | POA: Diagnosis not present

## 2016-09-29 ENCOUNTER — Ambulatory Visit: Payer: BLUE CROSS/BLUE SHIELD | Admitting: Rheumatology

## 2016-10-30 ENCOUNTER — Other Ambulatory Visit: Payer: Self-pay | Admitting: Physician Assistant

## 2016-10-31 NOTE — Telephone Encounter (Signed)
Refill appropriate 

## 2016-11-01 ENCOUNTER — Ambulatory Visit: Payer: BLUE CROSS/BLUE SHIELD | Admitting: Rheumatology

## 2016-12-16 ENCOUNTER — Other Ambulatory Visit: Payer: Self-pay | Admitting: Physician Assistant

## 2016-12-16 NOTE — Telephone Encounter (Signed)
Refill appropriate 

## 2017-01-30 ENCOUNTER — Other Ambulatory Visit: Payer: Self-pay | Admitting: Physician Assistant

## 2017-01-30 NOTE — Telephone Encounter (Signed)
Medication filled x1 with no refills.   Requires office visit before any further refills can be given.   Letter sent.  

## 2017-01-31 ENCOUNTER — Other Ambulatory Visit: Payer: Self-pay | Admitting: Physician Assistant

## 2017-02-22 ENCOUNTER — Other Ambulatory Visit: Payer: Self-pay | Admitting: Physician Assistant

## 2017-02-27 ENCOUNTER — Other Ambulatory Visit: Payer: Self-pay | Admitting: Physician Assistant

## 2017-02-27 NOTE — Telephone Encounter (Signed)
rx filled per protocol  

## 2017-03-14 ENCOUNTER — Other Ambulatory Visit: Payer: Self-pay

## 2017-03-14 MED ORDER — VERAPAMIL HCL ER 240 MG PO TBCR: 240.0000 mg | EXTENDED_RELEASE_TABLET | Freq: Every day | ORAL | 0 refills | Status: DC

## 2017-03-14 NOTE — Telephone Encounter (Signed)
Patient due for an office visit. Letter mailed

## 2017-03-23 ENCOUNTER — Ambulatory Visit: Payer: BLUE CROSS/BLUE SHIELD | Admitting: Physician Assistant

## 2017-03-23 ENCOUNTER — Other Ambulatory Visit: Payer: Self-pay

## 2017-03-23 ENCOUNTER — Encounter: Payer: Self-pay | Admitting: Physician Assistant

## 2017-03-23 VITALS — BP 124/72 | HR 58 | Temp 97.8°F | Resp 16 | Wt 227.4 lb

## 2017-03-23 DIAGNOSIS — I10 Essential (primary) hypertension: Secondary | ICD-10-CM

## 2017-03-23 DIAGNOSIS — Z23 Encounter for immunization: Secondary | ICD-10-CM | POA: Diagnosis not present

## 2017-03-24 LAB — BASIC METABOLIC PANEL WITH GFR
BUN: 16 mg/dL (ref 7–25)
CHLORIDE: 98 mmol/L (ref 98–110)
CO2: 29 mmol/L (ref 20–32)
Calcium: 10 mg/dL (ref 8.6–10.3)
Creat: 1.21 mg/dL (ref 0.70–1.25)
GFR, EST AFRICAN AMERICAN: 74 mL/min/{1.73_m2} (ref >=60)
GFR, Est Non African American: 64 mL/min/{1.73_m2} (ref >=60)
Glucose, Bld: 85 mg/dL (ref 65–99)
POTASSIUM: 4.4 mmol/L (ref 3.5–5.3)
Sodium: 138 mmol/L (ref 135–146)

## 2017-03-24 LAB — EXTRA LAV TOP TUBE

## 2017-03-24 NOTE — Progress Notes (Signed)
    Patient ID: Frederick Hicks MRN: [id number], DOB: 06/23/1955, 61 y.o. Date of Encounter: 03/24/2017, 7:34 AM    Chief Complaint:  Chief Complaint  Patient presents with  . 6 month follow up     HPI: 61 y.o. year old male presents with above.   His LOV with me was 05/09/2016.  Says that he has had the following updates regarding his health: Says he had problems with his right knee-- found out it was the meniscus---but did not have surgery-- work is too busy--he could not be out work that [employer] knee actually has not been bothering him too much.  Says he occasionally feels pain down lateral aspect left leg. I explained this c/w sciatica, which comes from low back. Says he did have some low back pain for a while. Says this is very minor and does not need treatment--just wanted to let me know.  Says he had eye exam and that was good.  He is taking BP meds as directed. No lightheadedness or other adverse effects.  Agreeable to get Flu vaccine today.     Home Meds:   Outpatient Medications Prior to Visit  Medication Sig Dispense Refill  . aspirin 81 MG tablet Take 81 mg by mouth daily.    Marland Kitchen BYSTOLIC 5 MG tablet TAKE 1 TABLET BY MOUTH EVERY DAY 90 tablet 1  . Cholecalciferol (VITAMIN D-3) 5000 units TABS Take 1 tablet by mouth daily. 30 tablet   . diclofenac (VOLTAREN) 75 MG EC tablet Take 1 tablet (75 mg total) by mouth 2 (two) times daily. 60 tablet 1  . hydrochlorothiazide (HYDRODIURIL) 25 MG tablet TAKE 1 TABLET BY MOUTH EVERY DAY 90 tablet 1  . losartan (COZAAR) 100 MG tablet TAKE 1 TABLET BY MOUTH EVERY DAY NEEDS OFFICE VISIT FOR REFILLS 30 tablet 0  . verapamil (CALAN-SR) 240 MG CR tablet Take 1 tablet (240 mg total) by mouth daily. 90 tablet 0   No facility-administered medications prior to visit.     Allergies:  Allergies  Allergen Reactions  . Lisinopril Cough  . Simvastatin Other (See Comments)    Mylagias      Review of Systems: See HPI for pertinent ROS. All  other ROS negative.    Physical Exam: Blood pressure 124/72, pulse (!) 58, temperature 97.8 F (36.6 C), resp. rate 16, weight 103.1 kg (227 lb 6.4 oz), SpO2 98 %., Body mass index is 30 kg/m. General:  WNWD WM. Appears in no acute distress. Neck: Supple. No thyromegaly. No lymphadenopathy. No carotid bruits. Lungs: Clear bilaterally to auscultation without wheezes, rales, or rhonchi. Breathing is unlabored. Heart: Regular rhythm. No murmurs, rubs, or gallops. Msk:  Strength and tone normal for age. Extremities/Skin: Warm and dry.  No edema.  Neuro: Alert and oriented X 3. Moves all extremities spontaneously. Gait is normal. CNII-XII grossly in tact. Psych:  Responds to questions appropriately with a normal affect.     ASSESSMENT AND PLAN:  61 y.o. year old male with  1. Essential hypertension BP is excellent. Cont current meds. Check lab to monitor.  - BASIC METABOLIC PANEL WITH GFR  2. Flu vaccine need - Flu Vaccine QUAD 62+ mos IM  Last PSA 62/2018----wait to recheck this once > 1 year Last FLP 01/2015--LDL- 118. Not fasting today. Can wait to recheck this. For Preventive Care---See CPE 02/2015  F/U OV 6 months, sooner if needed.   [address] [place], [place], [facility] 03/24/2017 7:34 AM

## 2017-05-10 ENCOUNTER — Other Ambulatory Visit: Payer: Self-pay | Admitting: Physician Assistant

## 2017-05-10 NOTE — Telephone Encounter (Signed)
Refill appropriate 

## 2017-06-09 ENCOUNTER — Other Ambulatory Visit: Payer: Self-pay | Admitting: Physician Assistant

## 2017-06-09 NOTE — Telephone Encounter (Signed)
Refill appropriate 

## 2017-07-06 ENCOUNTER — Other Ambulatory Visit: Payer: Self-pay | Admitting: Physician Assistant

## 2017-07-06 NOTE — Telephone Encounter (Signed)
Refill appropriate 

## 2017-08-03 ENCOUNTER — Other Ambulatory Visit: Payer: Self-pay | Admitting: Physician Assistant

## 2017-08-13 ENCOUNTER — Other Ambulatory Visit: Payer: Self-pay | Admitting: Physician Assistant

## 2017-08-22 ENCOUNTER — Other Ambulatory Visit: Payer: Self-pay | Admitting: Physician Assistant

## 2017-09-01 ENCOUNTER — Other Ambulatory Visit: Payer: Self-pay | Admitting: Physician Assistant

## 2017-09-12 ENCOUNTER — Other Ambulatory Visit: Payer: Self-pay | Admitting: Physician Assistant

## 2017-09-21 ENCOUNTER — Ambulatory Visit: Payer: BLUE CROSS/BLUE SHIELD | Admitting: Physician Assistant

## 2017-09-29 ENCOUNTER — Telehealth: Payer: Self-pay

## 2017-09-29 NOTE — Telephone Encounter (Signed)
[facility] Verapamil ER 240MG  tablet  Is on back order. Pls advise on medication change

## 2017-09-30 ENCOUNTER — Other Ambulatory Visit: Payer: Self-pay | Admitting: Physician Assistant

## 2017-10-02 NOTE — Telephone Encounter (Signed)
[facility] yet.   If not-- then see what other pharmacy would be close in location and see if they have this medication.

## 2017-10-04 NOTE — Telephone Encounter (Signed)
Call placed to pharmacy they stated they had received the medication and the patient picked it up on 10/03/2017

## 2017-10-30 ENCOUNTER — Other Ambulatory Visit: Payer: Self-pay | Admitting: Physician Assistant

## 2017-10-30 NOTE — Telephone Encounter (Signed)
letter mailed to patient informing him that he is due for an office visit and to call to schedule the appointment.

## 2017-11-16 ENCOUNTER — Other Ambulatory Visit: Payer: Self-pay

## 2017-11-16 ENCOUNTER — Ambulatory Visit (INDEPENDENT_AMBULATORY_CARE_PROVIDER_SITE_OTHER): Payer: BLUE CROSS/BLUE SHIELD | Admitting: Physician Assistant

## 2017-11-16 ENCOUNTER — Encounter: Payer: Self-pay | Admitting: Physician Assistant

## 2017-11-16 VITALS — BP 132/74 | HR 61 | Temp 98.2°F | Resp 16 | Ht 72.25 in | Wt 222.0 lb

## 2017-11-16 DIAGNOSIS — E559 Vitamin D deficiency, unspecified: Secondary | ICD-10-CM | POA: Diagnosis not present

## 2017-11-16 DIAGNOSIS — Z0000 Encounter for general adult medical examination without abnormal findings: Secondary | ICD-10-CM

## 2017-11-16 DIAGNOSIS — Z125 Encounter for screening for malignant neoplasm of prostate: Secondary | ICD-10-CM | POA: Diagnosis not present

## 2017-11-16 DIAGNOSIS — I10 Essential (primary) hypertension: Secondary | ICD-10-CM

## 2017-11-16 NOTE — Progress Notes (Signed)
Patient ID: Frederick Hicks MRN: [id number], DOB: Mar 11, 1956 62 y.o. Date of Encounter: 11/16/2017, 11:35 AM    Chief Complaint: Physical (CPE)  HPI: 62 y.o. y/o male here for CPE.     He has no specific concerns today.  He states that he has been feeling pretty good and things have been feeling stable from a medical standpoint.  He reports that for his work--- he has been going to [place] every other week.  Also has been has gone to [place] some for work.  His family and his past wife's family his wife is deceased) are all in [place] and [place].  He says that anytime he goes out there for work, they have him come over for visit.  Says that he also enjoys a lot of these people that he works with and has had relationships with them through work for many years now so he actually does enjoy his time during these travels with work.  He also states that he now is living next to the [place] for the [employer] here.  Says that "that has been a life saver" for him.  Says that there are a lot of great people and that he has really enjoyed those relationships.  Says that he actually did go to some yoga classes at the church and that those exercises helped his knee and that they even told him some exercises he could do at home to help manage his knee pain and that has really helped.  Continues to take all of his blood pressure medications as directed.  These are causing no lightheadedness or other adverse effects.  He continues to take the vitamin D supplement as directed.    Review of Systems: Consitutional: No fever, chills, fatigue, night sweats, lymphadenopathy, or weight changes. Eyes: No visual changes, eye redness, or discharge. ENT/Mouth: Ears: No otalgia, tinnitus, hearing loss, discharge. Nose: No congestion, rhinorrhea, sinus pain, or epistaxis. Throat: No sore throat, post nasal drip, or teeth pain. Cardiovascular: No CP, palpitations, diaphoresis, DOE, edema, orthopnea,  PND. Respiratory: No cough, hemoptysis, SOB, or wheezing. Gastrointestinal: No anorexia, dysphagia, reflux, pain, nausea, vomiting, hematemesis, diarrhea, constipation, BRBPR, or melena. Genitourinary: No dysuria, frequency, urgency, hematuria, incontinence, nocturia, decreased urinary stream, discharge, impotence, or testicular pain/masses. Musculoskeletal: No decreased ROM, myalgias, stiffness, joint swelling, or weakness. Skin: No rash, erythema, lesion changes, pain, warmth, jaundice, or pruritis. Neurological: No headache, dizziness, syncope, seizures, tremors, memory loss, coordination problems, or paresthesias. Psychological: No anxiety, depression, hallucinations, SI/HI. Endocrine: No fatigue, polydipsia, polyphagia, polyuria, or known diabetes. All other systems were reviewed and are otherwise negative.  Past Medical History:  Diagnosis Date  . Hiatal hernia   . Hypertension      Past Surgical History:  Procedure Laterality Date  . ANKLE FRACTURE SURGERY     left, pinning  . TONSILLECTOMY      Home Meds:  Outpatient Medications Prior to Visit  Medication Sig Dispense Refill  . aspirin 81 MG tablet Take 81 mg by mouth daily.    Marland Kitchen BYSTOLIC 5 MG tablet TAKE 1 TABLET BY MOUTH EVERY DAY 90 tablet 1  . Cholecalciferol (VITAMIN D-3) 5000 units TABS Take 1 tablet by mouth daily. 30 tablet   . hydrochlorothiazide (HYDRODIURIL) 25 MG tablet TAKE 1 TABLET BY MOUTH EVERY DAY 90 tablet 0  . losartan (COZAAR) 100 MG tablet TAKE 1 TABLET BY MOUTH EVERY DAY NEEDS OFFICE VISIT FOR REFILLS 30 tablet 0  . verapamil (CALAN-SR) 240 MG CR tablet  TAKE 1 TABLET (240 MG TOTAL) BY MOUTH DAILY. 90 tablet 0  . diclofenac (VOLTAREN) 75 MG EC tablet Take 1 tablet (75 mg total) by mouth 2 (two) times daily. (Patient not taking: Reported on 11/16/2017) 60 tablet 1   No facility-administered medications prior to visit.     Allergies:  Allergies  Allergen Reactions  . Lisinopril Cough  . Simvastatin  Other (See Comments)    Mylagias    Social History   Socioeconomic History  . Marital status: Widowed    Spouse name: Not on file  . Number of children: Not on file  . Years of education: Not on file  . Highest education level: Not on file  Occupational History  . Not on file  Social Needs  . Financial resource strain: Not on file  . Food insecurity:    Worry: Not on file    Inability: Not on file  . Transportation needs:    Medical: Not on file    Non-medical: Not on file  Tobacco Use  . Smoking status: Former Smoker    Types: Cigars    Last attempt to quit: 04/18/1997    Years since quitting: 20.5  . Smokeless tobacco: Never Used  Substance and Sexual Activity  . Alcohol use: Yes    Alcohol/week: 10.8 oz    Types: 18 Cans of beer per week  . Drug use: No  . Sexual activity: Not on file  Lifestyle  . Physical activity:    Days per week: Not on file    Minutes per session: Not on file  . Stress: Not on file  Relationships  . Social connections:    Talks on phone: Not on file    Gets together: Not on file    Attends religious service: Not on file    Active member of club or organization: Not on file    Attends meetings of clubs or organizations: Not on file    Relationship status: Not on file  . Intimate partner violence:    Fear of current or ex partner: Not on file    Emotionally abused: Not on file    Physically abused: Not on file    Forced sexual activity: Not on file  Other Topics Concern  . Not on file  Social History Narrative   Wife passed away--secondary to acute MI   Pt works--binding books   Has son and grandchildren nearby    Family History  Problem Relation Age of Onset  . Gout Maternal Uncle   . Gout Maternal Grandfather   . 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 Colon cancer Neg Hx     Physical Exam: Blood pressure 132/74, pulse 61, temperature 98.2 F (36.8 C), temperature source Oral, resp.  rate 16, height 6' 0.25" (1.835 m), weight 100.7 kg (222 lb), SpO2 98 %.  General: Well developed, well nourished WM. Appears in no acute distress. HEENT: Normocephalic, atraumatic. Conjunctiva pink, sclera non-icteric. Pupils 2 mm constricting to 1 mm, round, regular, and equally reactive to light and accomodation. EOMI. Internal auditory canal clear. TMs with good cone of light and without pathology. Nasal mucosa pink. Nares are without discharge. No sinus tenderness. Oral mucosa pink. Neck: Supple. Trachea midline. No thyromegaly. Full ROM. No lymphadenopathy. Lungs: Clear to auscultation bilaterally without wheezes, rales, or rhonchi. Breathing is of normal effort and unlabored. Cardiovascular: RRR with S1 S2. No murmurs, rubs, or gallops. Distal pulses  2+ symmetrically. No carotid or abdominal bruits. Abdomen: Soft, non-tender, non-distended with normoactive bowel sounds. No hepatosplenomegaly or masses. No rebound/guarding. No CVA tenderness. No hernias. Musculoskeletal: Full range of motion and 5/5 strength throughout.  Skin: Warm and moist without erythema, ecchymosis, wounds, or rash. Neuro: A+Ox3. CN II-XII grossly intact. Moves all extremities spontaneously. Full sensation throughout. Normal gait.  Psych:  Responds to questions appropriately with a normal affect.   Assessment/Plan:   62 y.o. y/o  male here for CPE  -1. Encounter for preventive health examination  A. Screening Labs: He is fasting.  Check labs now. - CBC with Differential/Platelet - COMPLETE METABOLIC PANEL WITH GFR - Lipid panel - PSA - TSH - VITAMIN D 25 Hydroxy (Vit-D Deficiency, Fractures)  B. Screening For Prostate Cancer: - PSA  C. Screening For Colorectal Cancer:  His last colonoscopy was 04/01/2015.  D. Immunizations: Flu---------------------N/A Tetanus  ----------- he received tetanus vaccine 02/27/2015 Pneumococcal-----he has no indication to require pneumonia vaccine until age  77 Shingrix------------- He is to check with his insurance regarding coverage/cost and if he wants to proceed with getting this will receive this at pharmacy.    2. Essential hypertension Blood pressure is at goal.  Continue current medications.  Check lab to monitor. - COMPLETE METABOLIC PANEL WITH GFR  3. Vitamin D deficiency He continues vitamin D supplement daily.  Recheck vitamin D level to monitor. - VITAMIN D 25 Hydroxy (Vit-D Deficiency, Fractures)  4. Prostate cancer screening Recheck PSA to screen for prostate cancer. - PSA       Signed:   [address] [place], [place]  11/16/2017 11:35 AM

## 2017-11-17 LAB — COMPLETE METABOLIC PANEL WITH GFR
AG Ratio: 2.1 (calc) (ref 1.0–2.5)
ALBUMIN MSPROF: 4.6 g/dL (ref 3.6–5.1)
ALKALINE PHOSPHATASE (APISO): 65 U/L (ref 40–115)
ALT: 19 U/L (ref 9–46)
AST: 17 U/L (ref 10–35)
BUN: 16 mg/dL (ref 7–25)
CO2: 27 mmol/L (ref 20–32)
CREATININE: 1.21 mg/dL (ref 0.70–1.25)
Calcium: 9.9 mg/dL (ref 8.6–10.3)
Chloride: 104 mmol/L (ref 98–110)
GFR, EST AFRICAN AMERICAN: 73 mL/min/{1.73_m2} (ref >=60)
GFR, Est Non African American: 63 mL/min/{1.73_m2} (ref >=60)
Globulin: 2.2 g/dL (calc) (ref 1.9–3.7)
Glucose, Bld: 94 mg/dL (ref 65–99)
Potassium: 4.9 mmol/L (ref 3.5–5.3)
SODIUM: 141 mmol/L (ref 135–146)
TOTAL PROTEIN: 6.8 g/dL (ref 6.1–8.1)
Total Bilirubin: 0.6 mg/dL (ref 0.2–1.2)

## 2017-11-17 LAB — CBC WITH DIFFERENTIAL/PLATELET
BASOS ABS: 33 {cells}/uL (ref 0–200)
Basophils Relative: 0.5 %
EOS PCT: 1.4 %
Eosinophils Absolute: 92 cells/uL (ref 15–500)
HCT: 41.2 % (ref 38.5–50.0)
HEMOGLOBIN: 14.1 g/dL (ref 13.2–17.1)
Lymphs Abs: 1808 cells/uL (ref 850–3900)
MCH: 31.7 pg (ref 27.0–33.0)
MCHC: 34.2 g/dL (ref 32.0–36.0)
MCV: 92.6 fL (ref 80.0–100.0)
MONOS PCT: 10 %
MPV: 10.3 fL (ref 7.5–12.5)
NEUTROS ABS: 4006 {cells}/uL (ref 1500–7800)
Neutrophils Relative %: 60.7 %
PLATELETS: 243 10*3/uL (ref 140–400)
RBC: 4.45 10*6/uL (ref 4.20–5.80)
RDW: 13 % (ref 11.0–15.0)
TOTAL LYMPHOCYTE: 27.4 %
WBC mixed population: 660 cells/uL (ref 200–950)
WBC: 6.6 10*3/uL (ref 3.8–10.8)

## 2017-11-17 LAB — LIPID PANEL
CHOL/HDL RATIO: 5.3 (calc) — AB (ref <5.0)
Cholesterol: 171 mg/dL (ref <200)
HDL: 32 mg/dL — ABNORMAL LOW (ref >40)
LDL CHOLESTEROL (CALC): 109 mg/dL — AB
NON-HDL CHOLESTEROL (CALC): 139 mg/dL — AB (ref <130)
Triglycerides: 186 mg/dL — ABNORMAL HIGH (ref <150)

## 2017-11-17 LAB — VITAMIN D 25 HYDROXY (VIT D DEFICIENCY, FRACTURES): VIT D 25 HYDROXY: 48 ng/mL (ref 30–100)

## 2017-11-17 LAB — PSA: PSA: 1.8 ng/mL (ref <=4.0)

## 2017-11-17 LAB — TSH: TSH: 2.24 mIU/L (ref 0.40–4.50)

## 2017-11-28 ENCOUNTER — Other Ambulatory Visit: Payer: Self-pay | Admitting: Physician Assistant

## 2017-12-23 ENCOUNTER — Other Ambulatory Visit: Payer: Self-pay | Admitting: Physician Assistant

## 2017-12-29 ENCOUNTER — Other Ambulatory Visit: Payer: Self-pay | Admitting: Physician Assistant

## 2018-02-10 ENCOUNTER — Other Ambulatory Visit: Payer: Self-pay | Admitting: Physician Assistant

## 2018-02-11 ENCOUNTER — Other Ambulatory Visit: Payer: Self-pay | Admitting: Physician Assistant

## 2018-02-16 ENCOUNTER — Other Ambulatory Visit: Payer: Self-pay | Admitting: Physician Assistant

## 2018-03-11 ENCOUNTER — Other Ambulatory Visit: Payer: Self-pay | Admitting: Family Medicine

## 2018-03-26 ENCOUNTER — Other Ambulatory Visit: Payer: Self-pay

## 2018-03-26 MED ORDER — VERAPAMIL HCL ER 240 MG PO TBCR: 240.0000 mg | EXTENDED_RELEASE_TABLET | Freq: Every day | ORAL | 0 refills | Status: DC

## 2018-04-10 ENCOUNTER — Encounter: Payer: Self-pay | Admitting: Gastroenterology

## 2018-05-05 IMAGING — DX DG KNEE COMPLETE 4+V*L*
4 series · 4 of 4 positions shown · non-contrast
Comparison: None.

CLINICAL DATA: Chronic bilateral knee pain and swelling. Initial
encounter.

EXAM:
LEFT KNEE - COMPLETE 4+ VIEW

[dg knee complete 4 views left (1 of 4)]
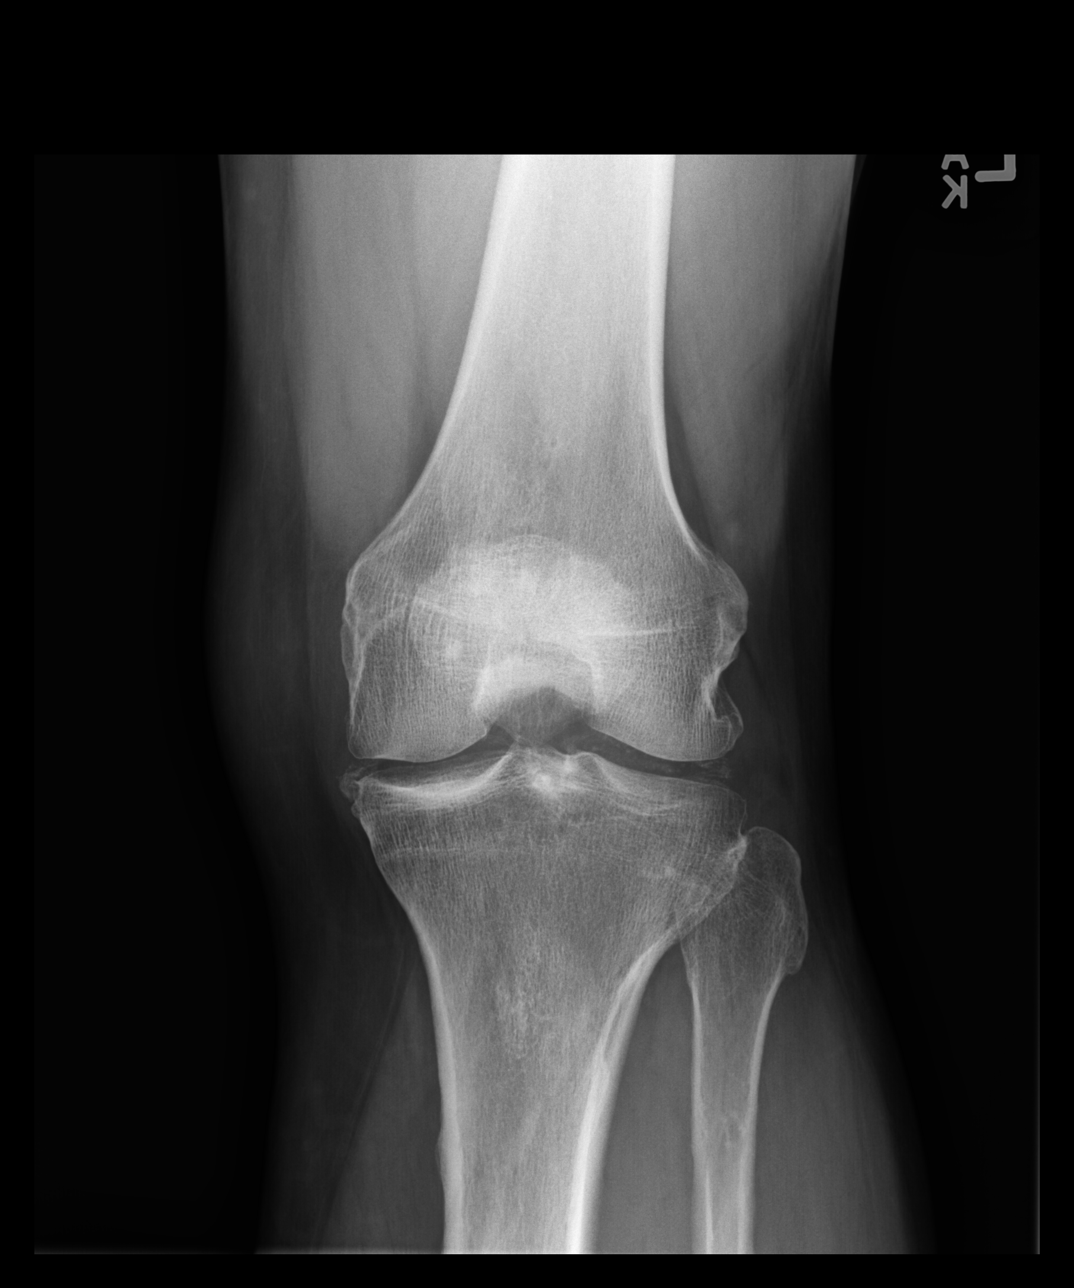

[dg knee complete 4 views left (2 of 4)]
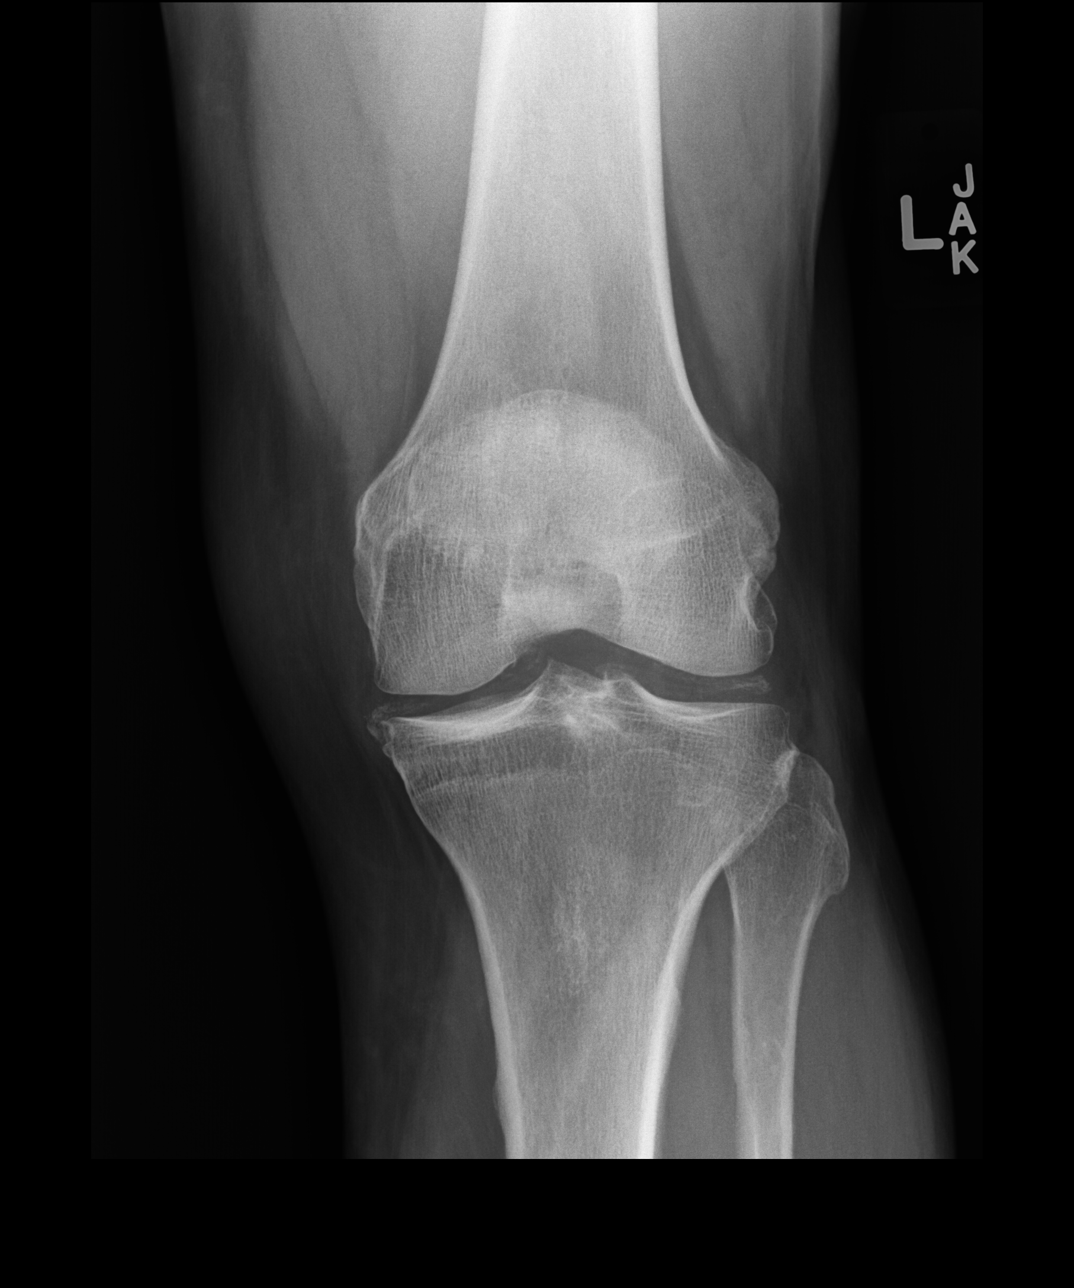

[dg knee complete 4 views left (3 of 4)]
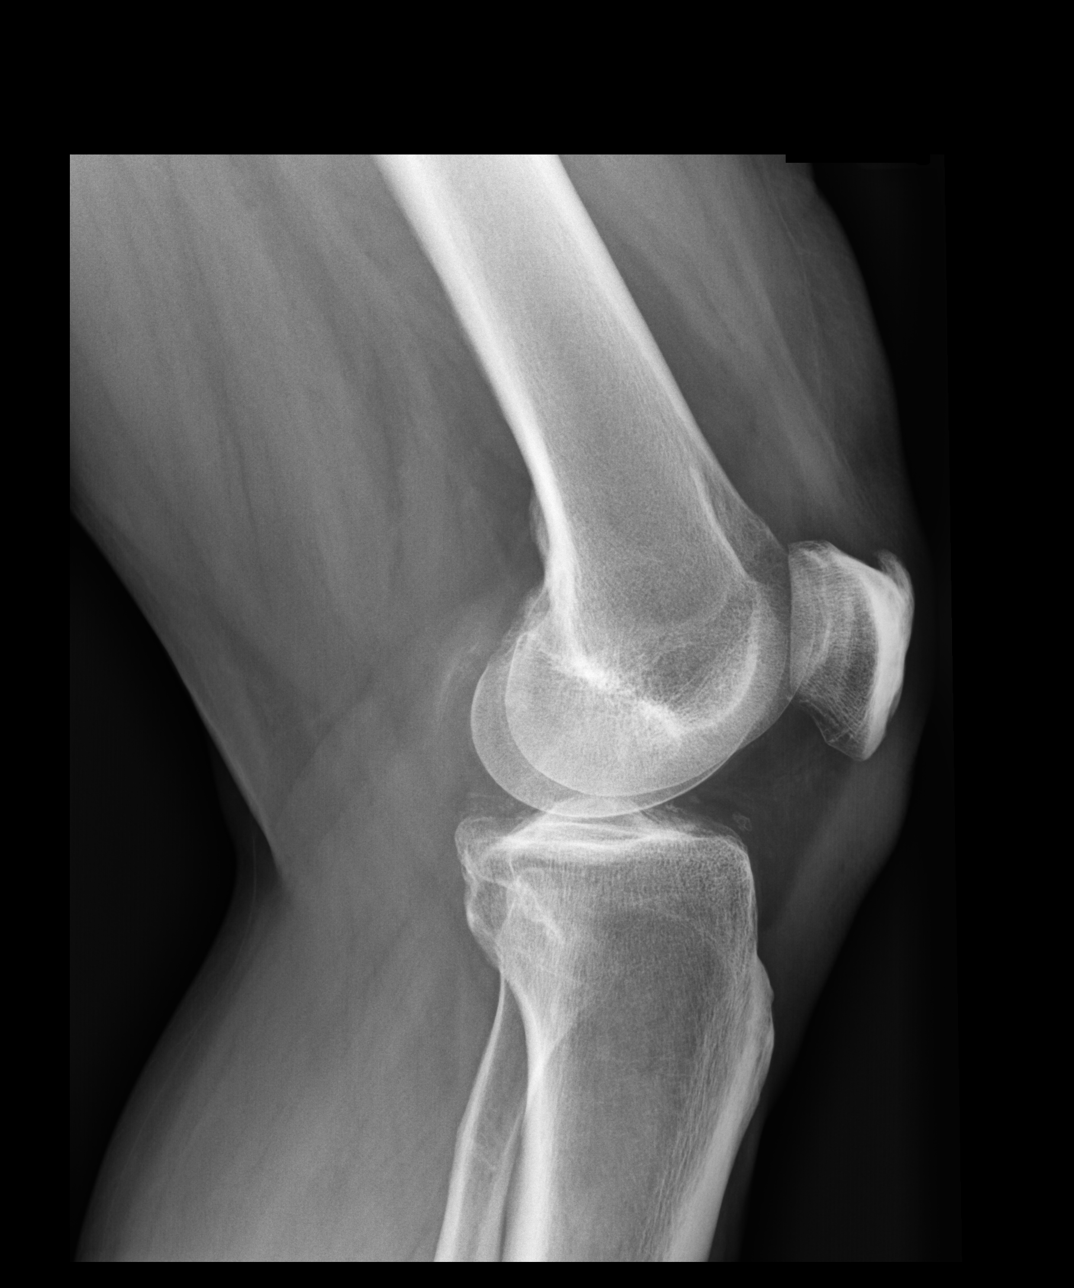

[dg knee complete 4 views left (4 of 4)]
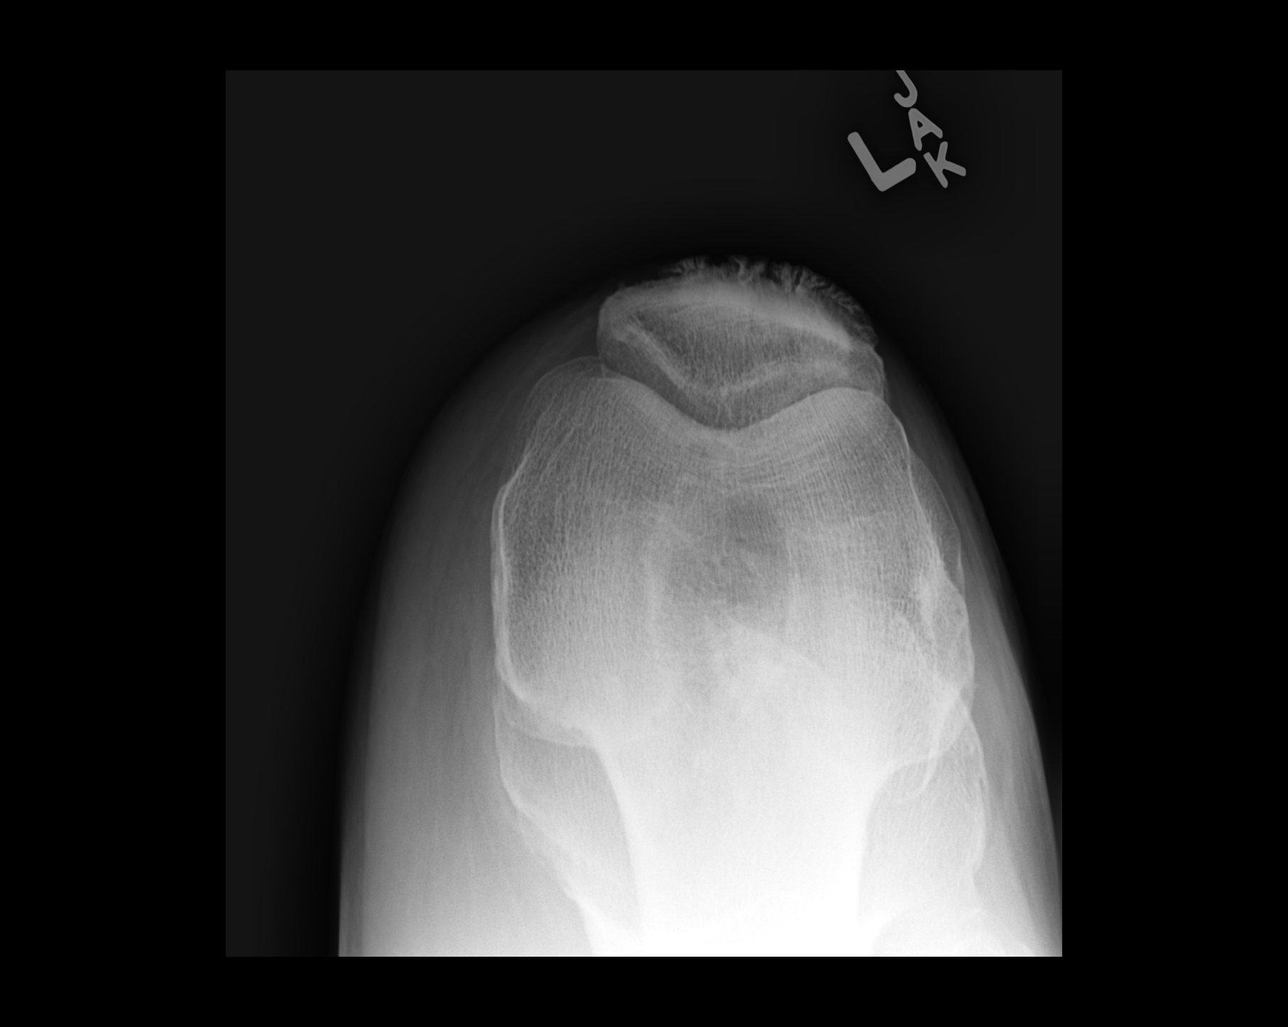

[4 of 4 positions shown; findings below may reference images not displayed]

FINDINGS: There is no evidence of fracture or dislocation. The joint spaces
are preserved. Chondrocalcinosis is noted. An enthesophyte is seen
arising at the upper pole of the patella.

No significant joint effusion is seen. The visualized soft tissues
are normal in appearance.
IMPRESSION: 1. No evidence of fracture or dislocation.
2. Chondrocalcinosis noted.

## 2018-05-05 IMAGING — DX DG KNEE COMPLETE 4+V*R*
4 series · 4 of 4 positions shown · non-contrast
Comparison: None.

CLINICAL DATA: Chronic bilateral knee pain and swelling, worse on
the right. Initial encounter.

EXAM:
RIGHT KNEE - COMPLETE 4+ VIEW

[dg knee complete 4 views right (1 of 4)]
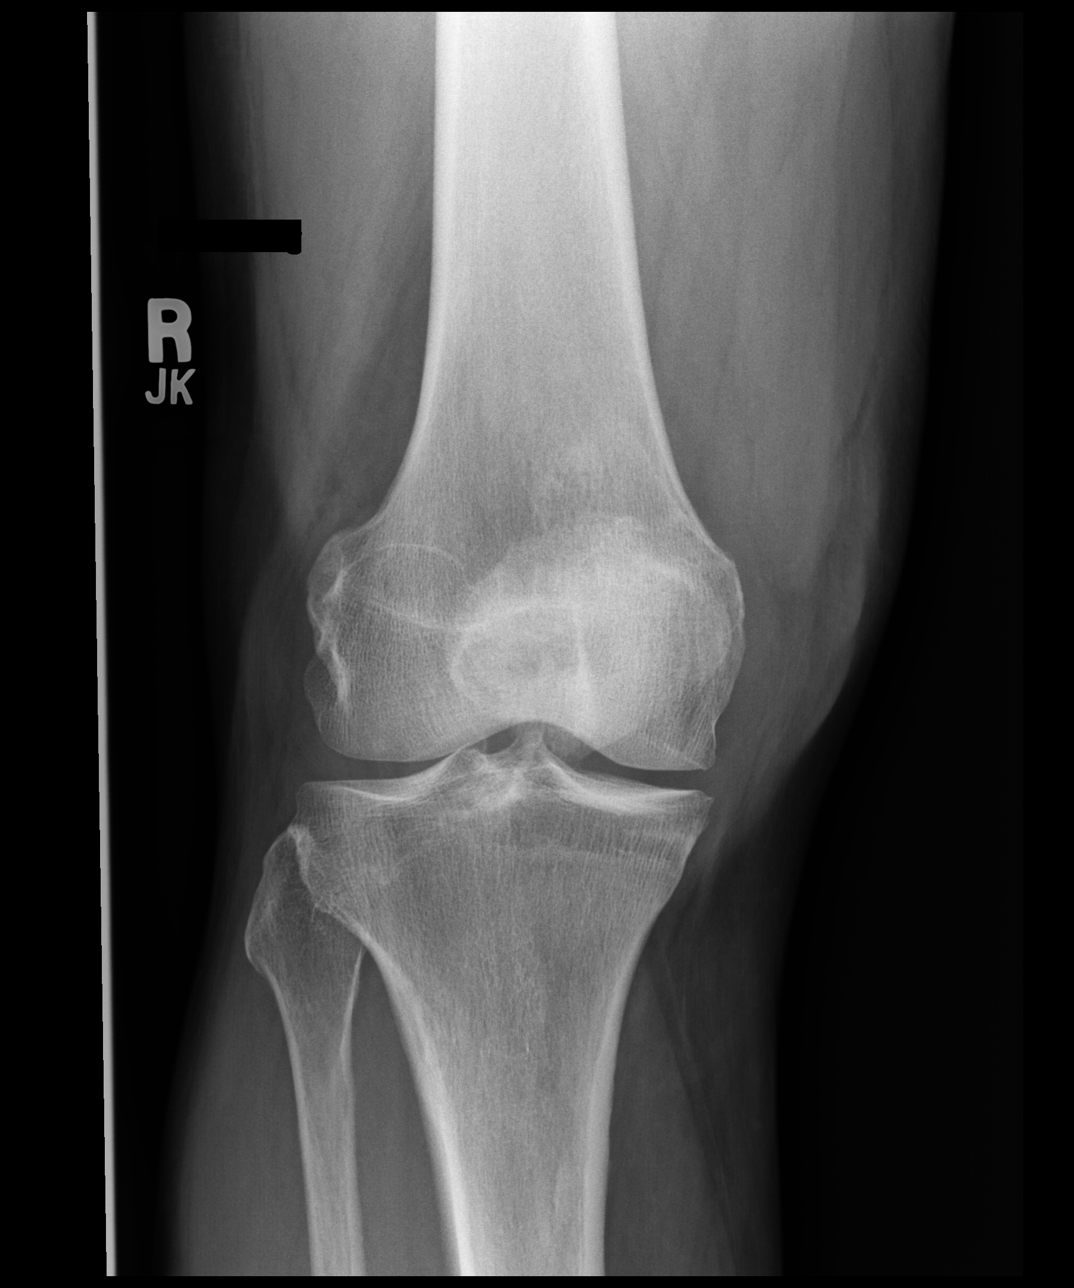

[dg knee complete 4 views right (2 of 4)]
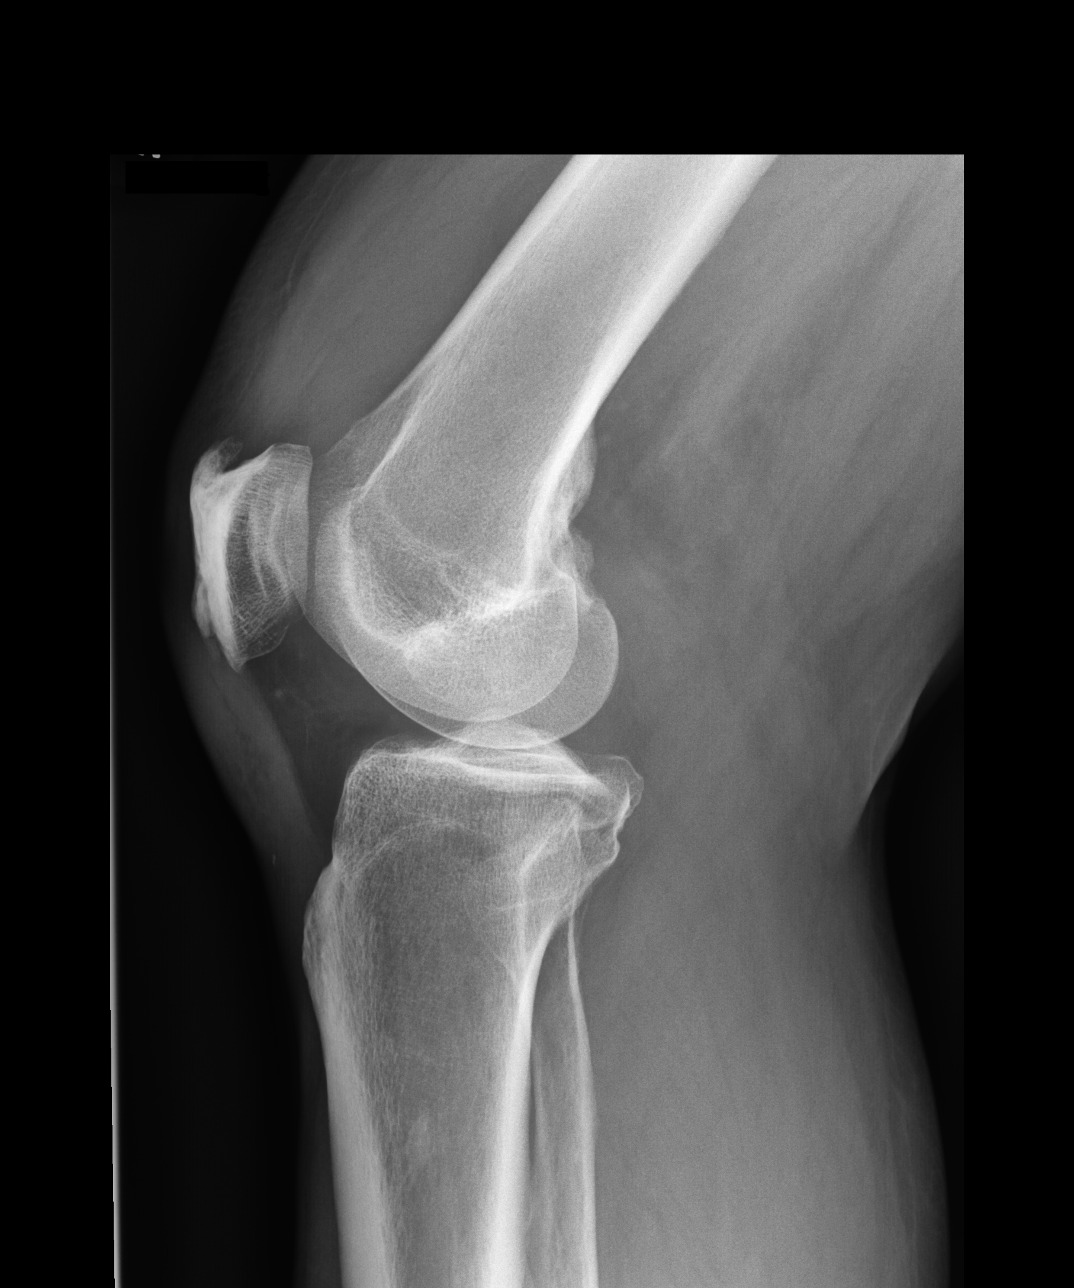

[dg knee complete 4 views right (3 of 4)]
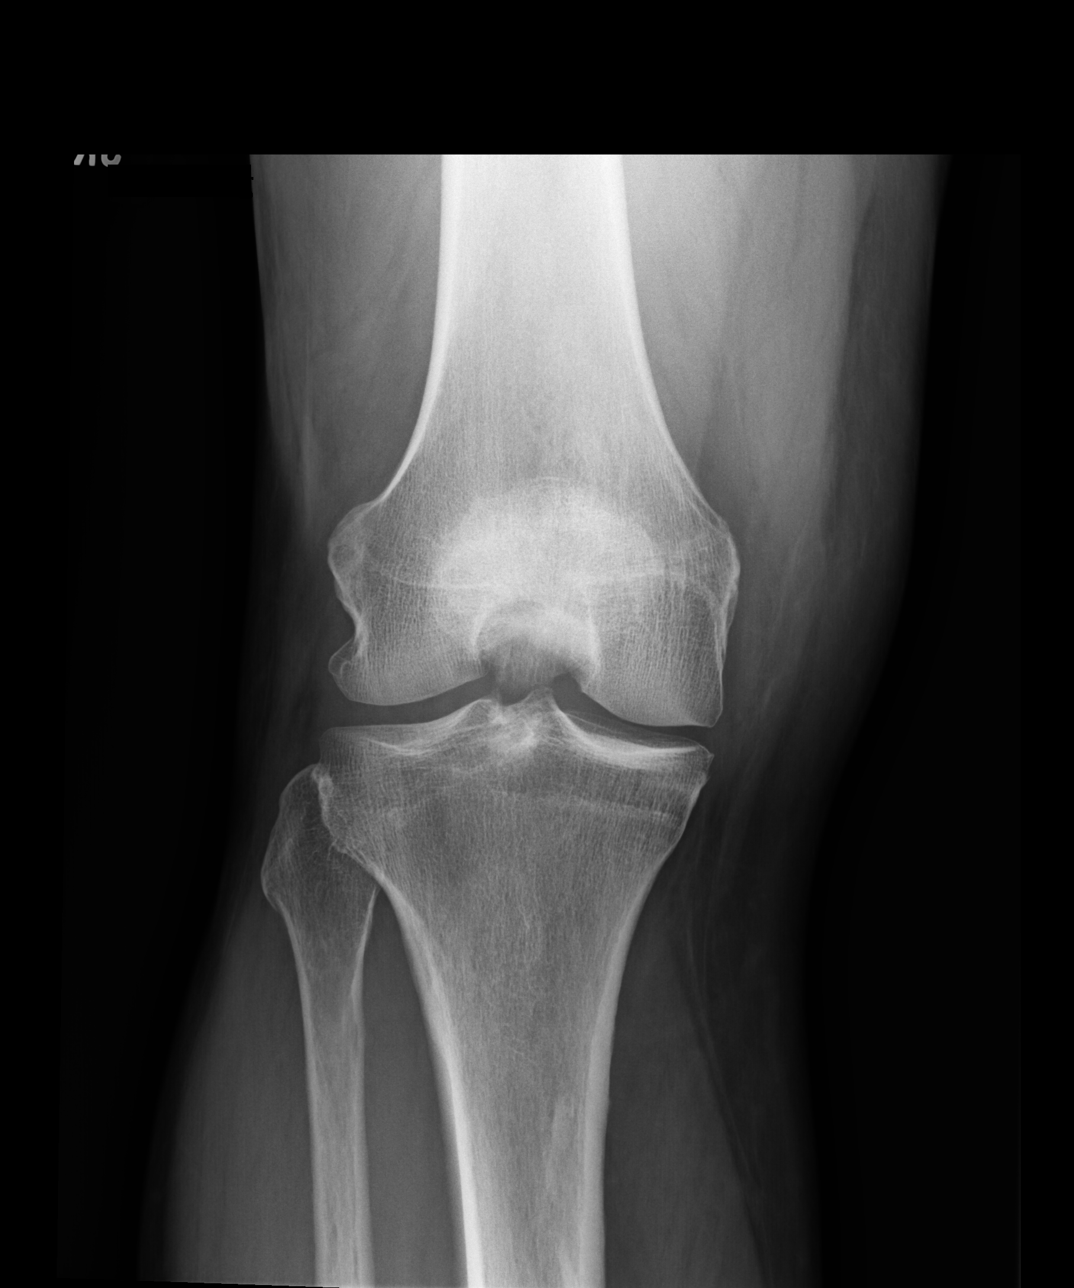

[dg knee complete 4 views right (4 of 4)]
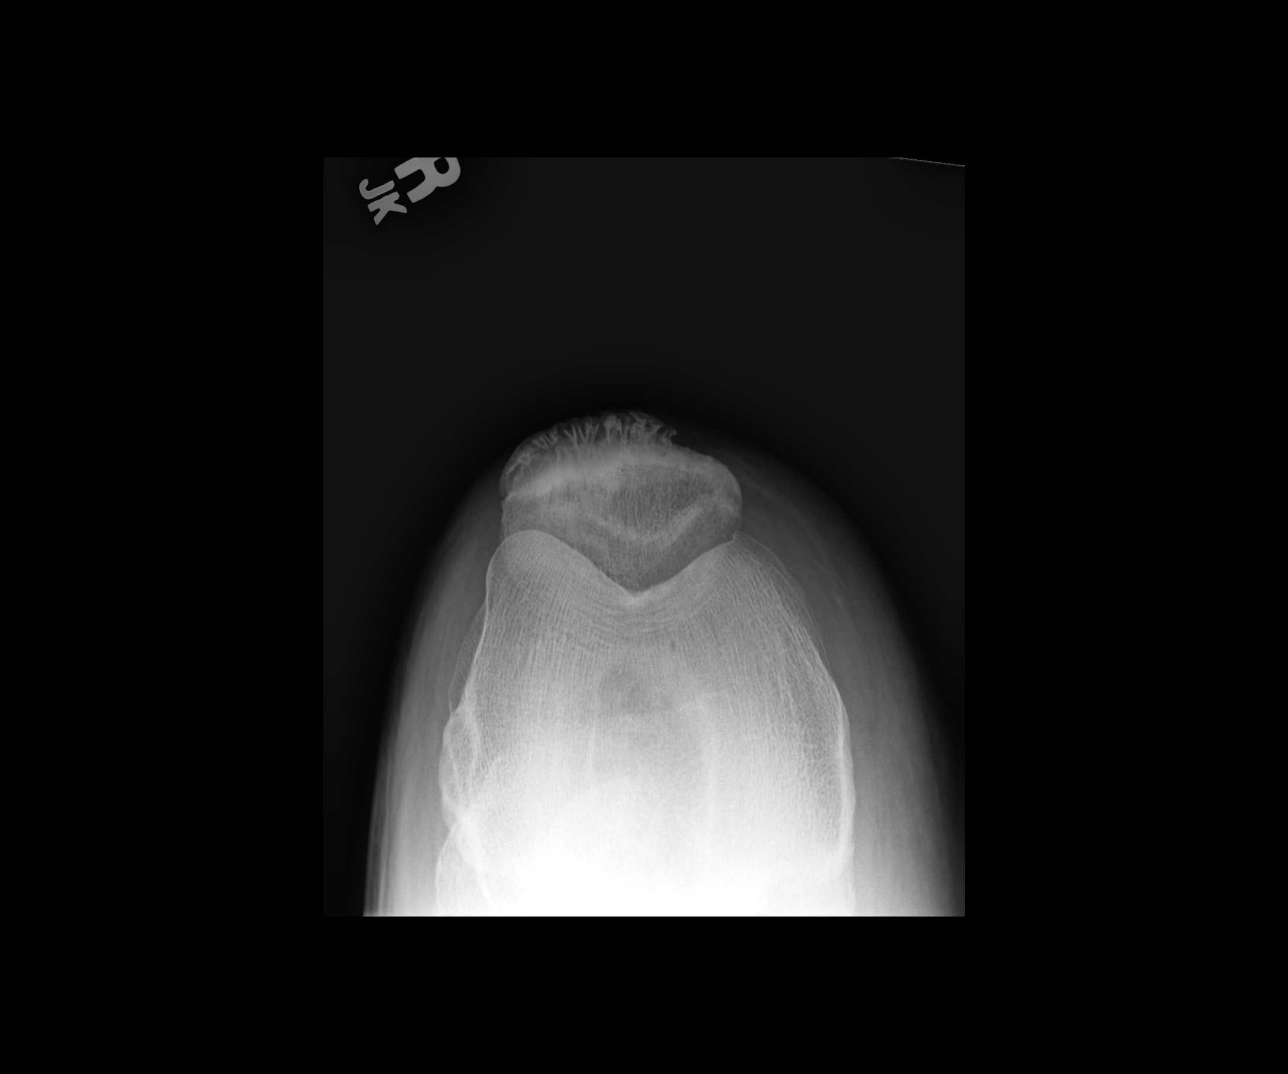

[4 of 4 positions shown; findings below may reference images not displayed]

FINDINGS: There is no evidence of fracture or dislocation. The joint spaces
are preserved. There is mild squaring of the medial compartment.

A moderate knee joint effusion is noted. Medial soft tissue swelling
is noted.
IMPRESSION: 1. No evidence of fracture or dislocation.
2. Moderate knee joint effusion noted.

## 2018-05-19 ENCOUNTER — Other Ambulatory Visit: Payer: Self-pay | Admitting: Family Medicine

## 2018-05-21 ENCOUNTER — Ambulatory Visit: Payer: BLUE CROSS/BLUE SHIELD | Admitting: Physician Assistant

## 2018-06-06 ENCOUNTER — Encounter: Payer: Self-pay | Admitting: Internal Medicine

## 2018-06-20 ENCOUNTER — Other Ambulatory Visit: Payer: Self-pay | Admitting: Family Medicine

## 2018-06-20 MED ORDER — VERAPAMIL HCL ER 240 MG PO TBCR: 240.0000 mg | EXTENDED_RELEASE_TABLET | Freq: Every day | ORAL | 2 refills | Status: DC

## 2018-09-04 ENCOUNTER — Other Ambulatory Visit: Payer: Self-pay | Admitting: Family Medicine

## 2018-11-12 ENCOUNTER — Other Ambulatory Visit: Payer: Self-pay | Admitting: Family Medicine

## 2018-11-20 ENCOUNTER — Encounter: Payer: Self-pay | Admitting: Family Medicine

## 2018-11-20 ENCOUNTER — Other Ambulatory Visit: Payer: Self-pay

## 2018-11-20 ENCOUNTER — Ambulatory Visit (INDEPENDENT_AMBULATORY_CARE_PROVIDER_SITE_OTHER): Payer: BC Managed Care – PPO | Admitting: Family Medicine

## 2018-11-20 ENCOUNTER — Encounter: Payer: BLUE CROSS/BLUE SHIELD | Admitting: Family Medicine

## 2018-11-20 VITALS — BP 124/76 | HR 46 | Temp 98.3°F | Resp 14 | Ht 73.5 in | Wt 220.0 lb

## 2018-11-20 DIAGNOSIS — Z125 Encounter for screening for malignant neoplasm of prostate: Secondary | ICD-10-CM

## 2018-11-20 DIAGNOSIS — E785 Hyperlipidemia, unspecified: Secondary | ICD-10-CM | POA: Diagnosis not present

## 2018-11-20 DIAGNOSIS — I10 Essential (primary) hypertension: Secondary | ICD-10-CM | POA: Diagnosis not present

## 2018-11-20 DIAGNOSIS — Z0000 Encounter for general adult medical examination without abnormal findings: Secondary | ICD-10-CM

## 2018-11-20 DIAGNOSIS — Z1211 Encounter for screening for malignant neoplasm of colon: Secondary | ICD-10-CM

## 2018-11-20 DIAGNOSIS — Z0001 Encounter for general adult medical examination with abnormal findings: Secondary | ICD-10-CM

## 2018-11-20 NOTE — Progress Notes (Signed)
Subjective:    Patient ID: Frederick Hicks, male    DOB: [birth year], 63 y.o.   MRN: [id number]  HPI Patient is a very pleasant 63 year old Caucasian male here today for physical exam.  He denies any medical concerns.  His blood pressure is outstanding at 124/76 however he is profoundly bradycardic.  He does report feeling dizzy and tired at times.  He denies any syncope or near syncope.  He is on a combination of verapamil and Bystolic.  He has been on this for quite some time.  He reports lethargy at work.  He denies any chest pain or shortness of breath.  His last colonoscopy was 2016.  Due to polyps that have recommended a repeat colonoscopy in 3 years so he is overdue for that.  He is due for a PSA to screen for prostate cancer.  Reviewing his shot record he is due for a flu shot this fall.  He is also due for the shingles vaccine which I recommended once the pandemic subsides.  He is due for Prevnar 13 and Pneumovax 23 after the age of 5. Past Medical History:  Diagnosis Date  . Hiatal hernia   . Hypertension    Past Surgical History:  Procedure Laterality Date  . ANKLE FRACTURE SURGERY     left, pinning  . TONSILLECTOMY     Current Outpatient Medications on File Prior to Visit  Medication Sig Dispense Refill  . aspirin 81 MG tablet Take 81 mg by mouth daily.    Marland Kitchen BYSTOLIC 5 MG tablet TAKE 1 TABLET BY MOUTH EVERY DAY 90 tablet 0  . Cholecalciferol (VITAMIN D-3) 5000 units TABS Take 1 tablet by mouth daily. 30 tablet   . hydrochlorothiazide (HYDRODIURIL) 25 MG tablet TAKE 1 TABLET BY MOUTH EVERY DAY 90 tablet 1  . losartan (COZAAR) 100 MG tablet Take 1 tablet (100 mg total) by mouth daily. 90 tablet 3  . verapamil (CALAN-SR) 240 MG CR tablet Take 1 tablet (240 mg total) by mouth daily. 90 tablet 2   No current facility-administered medications on file prior to visit.    Allergies  Allergen Reactions  . Lisinopril Cough  . Simvastatin Other (See Comments)    Mylagias   Social  History   Socioeconomic History  . Marital status: Widowed    Spouse name: Not on file  . Number of children: Not on file  . Years of education: Not on file  . Highest education level: Not on file  Occupational History  . Not on file  Social Needs  . Financial resource strain: Not on file  . Food insecurity    Worry: Not on file    Inability: Not on file  . Transportation needs    Medical: Not on file    Non-medical: Not on file  Tobacco Use  . Smoking status: Former Smoker    Types: Cigars    Quit date: 04/18/1997    Years since quitting: 21.6  . Smokeless tobacco: Never Used  Substance and Sexual Activity  . Alcohol use: Yes    Alcohol/week: 18.0 standard drinks    Types: 18 Cans of beer per week  . Drug use: No  . Sexual activity: Not on file  Lifestyle  . Physical activity    Days per week: Not on file    Minutes per session: Not on file  . Stress: Not on file  Relationships  . Social connections    Talks on phone: Not on file  Gets together: Not on file    Attends religious service: Not on file    Active member of club or organization: Not on file    Attends meetings of clubs or organizations: Not on file    Relationship status: Not on file  . Intimate partner violence    Fear of current or ex partner: Not on file    Emotionally abused: Not on file    Physically abused: Not on file    Forced sexual activity: Not on file  Other Topics Concern  . Not on file  Social History Narrative   Wife passed away--secondary to acute MI   Pt works--binding books   Has son and grandchildren nearby   Family History  Problem Relation Age of Onset  . Cancer Father 21       lung 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  . Gout Maternal Uncle   . Gout Maternal Grandfather   . Colon cancer Neg Hx      Review of Systems  All other systems reviewed and are negative.      Objective:   Physical Exam Vitals signs reviewed.  Constitutional:      General: He  is not in acute distress.    Appearance: Normal appearance. He is normal weight. He is not ill-appearing, toxic-appearing or diaphoretic.  HENT:     Head: Normocephalic and atraumatic.     Right Ear: Tympanic membrane, ear canal and external ear normal. There is no impacted cerumen.     Left Ear: Tympanic membrane, ear canal and external ear normal. There is no impacted cerumen.     Nose: Nose normal. No congestion or rhinorrhea.     Mouth/Throat:     Mouth: Mucous membranes are moist.     Pharynx: Oropharynx is clear. No oropharyngeal exudate or posterior oropharyngeal erythema.  Eyes:     General: No scleral icterus.       Right eye: No discharge.        Left eye: No discharge.     Extraocular Movements: Extraocular movements intact.     Conjunctiva/sclera: Conjunctivae normal.     Pupils: Pupils are equal, round, and reactive to light.  Neck:     Musculoskeletal: Normal range of motion and neck supple. No neck rigidity or muscular tenderness.     Vascular: No carotid bruit.  Cardiovascular:     Rate and Rhythm: Regular rhythm. Bradycardia present.     Pulses: Normal pulses.     Heart sounds: Normal heart sounds. No murmur. No friction rub. No gallop.   Pulmonary:     Effort: Pulmonary effort is normal. No respiratory distress.     Breath sounds: Normal breath sounds. No stridor. No wheezing, rhonchi or rales.  Chest:     Chest wall: No tenderness.  Abdominal:     General: Abdomen is flat. Bowel sounds are normal. There is no distension.     Palpations: Abdomen is soft. There is no mass.     Tenderness: There is no abdominal tenderness. There is no guarding or rebound.     Hernia: No hernia is present.  Genitourinary:    Prostate: Normal.     Rectum: Normal.  Musculoskeletal:        General: No swelling, tenderness, deformity or signs of injury.     Right lower leg: No edema.     Left lower leg: No edema.  Lymphadenopathy:     Cervical: No cervical adenopathy.  Skin:  General: Skin is warm.     Capillary Refill: Capillary refill takes less than 2 seconds.     Coloration: Skin is not jaundiced or pale.     Findings: No bruising, erythema, lesion or rash.  Neurological:     General: No focal deficit present.     Mental Status: He is alert and oriented to person, place, and time. Mental status is at baseline.     Cranial Nerves: No cranial nerve deficit.     Sensory: No sensory deficit.     Motor: No weakness.     Coordination: Coordination normal.     Gait: Gait normal.     Deep Tendon Reflexes: Reflexes normal.  Psychiatric:        Mood and Affect: Mood normal.        Behavior: Behavior normal.        Thought Content: Thought content normal.        Judgment: Judgment normal.           Assessment & Plan:  The primary encounter diagnosis was Encounter for preventive health examination. Diagnoses of Essential hypertension, Prostate cancer screening, Dyslipidemia, and Colon cancer screening were also pertinent to this visit. I recommended decreasing verapamil to 120 mg a day and then rechecking blood pressure and heart rate in 1 week.  If he continues to experience bradycardia at the lower dose I would discontinue the medication all together.  He has been on the medication for quite some time so I hesitate to stop it abruptly due to the fear of palpitations and tachycardia.  I will check a CBC, CMP, fasting lipid panel, and PSA to screen for prostate cancer.  I will also contact his gastroenterologist to arrange a colonoscopy as this is overdue.  Otherwise his preventative care is up-to-date.

## 2018-11-21 ENCOUNTER — Encounter: Payer: Self-pay | Admitting: Internal Medicine

## 2018-11-21 LAB — COMPLETE METABOLIC PANEL WITH GFR
AG Ratio: 1.7 (calc) (ref 1.0–2.5)
ALT: 21 U/L (ref 9–46)
AST: 19 U/L (ref 10–35)
Albumin: 4.3 g/dL (ref 3.6–5.1)
Alkaline phosphatase (APISO): 67 U/L (ref 35–144)
BUN/Creatinine Ratio: 14 (calc) (ref 6–22)
BUN: 20 mg/dL (ref 7–25)
CO2: 27 mmol/L (ref 20–32)
Calcium: 9.9 mg/dL (ref 8.6–10.3)
Chloride: 101 mmol/L (ref 98–110)
Creat: 1.41 mg/dL — ABNORMAL HIGH (ref 0.70–1.25)
GFR, Est African American: 61 mL/min/{1.73_m2} (ref >=60)
GFR, Est Non African American: 52 mL/min/{1.73_m2} — ABNORMAL LOW (ref >=60)
Globulin: 2.6 g/dL (calc) (ref 1.9–3.7)
Glucose, Bld: 100 mg/dL — ABNORMAL HIGH (ref 65–99)
Potassium: 4.7 mmol/L (ref 3.5–5.3)
Sodium: 137 mmol/L (ref 135–146)
Total Bilirubin: 0.7 mg/dL (ref 0.2–1.2)
Total Protein: 6.9 g/dL (ref 6.1–8.1)

## 2018-11-21 LAB — LIPID PANEL
Cholesterol: 178 mg/dL (ref <200)
HDL: 34 mg/dL — ABNORMAL LOW (ref >=40)
LDL Cholesterol (Calc): 111 mg/dL (calc) — ABNORMAL HIGH
Non-HDL Cholesterol (Calc): 144 mg/dL (calc) — ABNORMAL HIGH (ref <130)
Total CHOL/HDL Ratio: 5.2 (calc) — ABNORMAL HIGH (ref <5.0)
Triglycerides: 209 mg/dL — ABNORMAL HIGH (ref <150)

## 2018-11-21 LAB — CBC WITH DIFFERENTIAL/PLATELET
Absolute Monocytes: 722 cells/uL (ref 200–950)
Basophils Absolute: 30 cells/uL (ref 0–200)
Basophils Relative: 0.4 %
Eosinophils Absolute: 129 cells/uL (ref 15–500)
Eosinophils Relative: 1.7 %
HCT: 40.3 % (ref 38.5–50.0)
Hemoglobin: 14.2 g/dL (ref 13.2–17.1)
Lymphs Abs: 2189 cells/uL (ref 850–3900)
MCH: 32.6 pg (ref 27.0–33.0)
MCHC: 35.2 g/dL (ref 32.0–36.0)
MCV: 92.6 fL (ref 80.0–100.0)
MPV: 10.2 fL (ref 7.5–12.5)
Monocytes Relative: 9.5 %
Neutro Abs: 4530 cells/uL (ref 1500–7800)
Neutrophils Relative %: 59.6 %
Platelets: 276 10*3/uL (ref 140–400)
RBC: 4.35 10*6/uL (ref 4.20–5.80)
RDW: 13.1 % (ref 11.0–15.0)
Total Lymphocyte: 28.8 %
WBC: 7.6 10*3/uL (ref 3.8–10.8)

## 2018-11-21 LAB — PSA: PSA: 1.7 ng/mL (ref <=4.0)

## 2018-11-22 ENCOUNTER — Telehealth: Payer: Self-pay

## 2018-11-22 MED ORDER — ATORVASTATIN CALCIUM 10 MG PO TABS: 10.0000 mg | ORAL_TABLET | Freq: Every day | ORAL | 0 refills | Status: DC

## 2018-11-22 NOTE — Telephone Encounter (Signed)
Error

## 2018-11-28 ENCOUNTER — Other Ambulatory Visit: Payer: Self-pay | Admitting: Family Medicine

## 2018-12-06 ENCOUNTER — Other Ambulatory Visit: Payer: Self-pay

## 2018-12-06 ENCOUNTER — Other Ambulatory Visit: Payer: BC Managed Care – PPO

## 2018-12-06 DIAGNOSIS — N289 Disorder of kidney and ureter, unspecified: Secondary | ICD-10-CM

## 2018-12-07 LAB — COMPLETE METABOLIC PANEL WITH GFR
AG Ratio: 1.9 (calc) (ref 1.0–2.5)
ALT: 34 U/L (ref 9–46)
AST: 26 U/L (ref 10–35)
Albumin: 4.2 g/dL (ref 3.6–5.1)
Alkaline phosphatase (APISO): 63 U/L (ref 35–144)
BUN: 14 mg/dL (ref 7–25)
CO2: 26 mmol/L (ref 20–32)
Calcium: 10 mg/dL (ref 8.6–10.3)
Chloride: 103 mmol/L (ref 98–110)
Creat: 1.18 mg/dL (ref 0.70–1.25)
GFR, Est African American: 75 mL/min/{1.73_m2} (ref >=60)
GFR, Est Non African American: 65 mL/min/{1.73_m2} (ref >=60)
Globulin: 2.2 g/dL (calc) (ref 1.9–3.7)
Glucose, Bld: 86 mg/dL (ref 65–99)
Potassium: 4.4 mmol/L (ref 3.5–5.3)
Sodium: 138 mmol/L (ref 135–146)
Total Bilirubin: 0.7 mg/dL (ref 0.2–1.2)
Total Protein: 6.4 g/dL (ref 6.1–8.1)

## 2018-12-14 ENCOUNTER — Other Ambulatory Visit: Payer: Self-pay

## 2018-12-14 ENCOUNTER — Ambulatory Visit (AMBULATORY_SURGERY_CENTER): Payer: Self-pay | Admitting: *Deleted

## 2018-12-14 VITALS — Temp 97.1°F | Ht 73.0 in | Wt 224.0 lb

## 2018-12-14 DIAGNOSIS — Z86010 Personal history of colonic polyps: Secondary | ICD-10-CM

## 2018-12-14 MED ORDER — NA SULFATE-K SULFATE-MG SULF 17.5-3.13-1.6 GM/177ML PO SOLN: ORAL | 0 refills | Status: DC

## 2018-12-14 NOTE — Progress Notes (Signed)
Patient denies any allergies to eggs or soy. Patient denies any problems with anesthesia/sedation. Patient denies any oxygen use at home. Patient denies taking any diet/weight loss medications or blood thinners. EMMI education assisgned to patient on colonoscopy, this was explained and instructions given to patient. Suprep coupon to pt. Pt is aware that care partner will wait in the car during procedure; if they feel like they will be too hot to wait in the car; they may wait in the lobby.  We want them to wear a mask (we do not have any that we can provide them), practice social distancing, and we will check their temperatures when they get here.  I did remind patient that their care partner needs to stay in the parking lot the entire time. Pt will wear mask into building.

## 2018-12-17 ENCOUNTER — Encounter: Payer: Self-pay | Admitting: Internal Medicine

## 2018-12-24 ENCOUNTER — Telehealth: Payer: Self-pay

## 2018-12-24 NOTE — Telephone Encounter (Signed)
Covid-19 screening questions   Do you now or have you had a fever in the last 14 days?  Do you have any respiratory symptoms of shortness of breath or cough now or in the last 14 days?  Do you have any family members or close contacts with diagnosed or suspected Covid-19 in the past 14 days?  Have you been tested for Covid-19 and found to be positive?       

## 2018-12-25 ENCOUNTER — Ambulatory Visit (AMBULATORY_SURGERY_CENTER): Payer: BC Managed Care – PPO | Admitting: Internal Medicine

## 2018-12-25 ENCOUNTER — Other Ambulatory Visit: Payer: Self-pay

## 2018-12-25 ENCOUNTER — Encounter: Payer: Self-pay | Admitting: Internal Medicine

## 2018-12-25 VITALS — BP 117/66 | HR 47 | Temp 97.4°F | Resp 19 | Ht 73.0 in | Wt 224.0 lb

## 2018-12-25 DIAGNOSIS — Z860100 Personal history of colon polyps, unspecified: Secondary | ICD-10-CM

## 2018-12-25 DIAGNOSIS — D124 Benign neoplasm of descending colon: Secondary | ICD-10-CM | POA: Diagnosis not present

## 2018-12-25 DIAGNOSIS — D128 Benign neoplasm of rectum: Secondary | ICD-10-CM

## 2018-12-25 DIAGNOSIS — Z1211 Encounter for screening for malignant neoplasm of colon: Secondary | ICD-10-CM | POA: Diagnosis not present

## 2018-12-25 DIAGNOSIS — K621 Rectal polyp: Secondary | ICD-10-CM | POA: Diagnosis not present

## 2018-12-25 DIAGNOSIS — D123 Benign neoplasm of transverse colon: Secondary | ICD-10-CM | POA: Diagnosis not present

## 2018-12-25 MED ORDER — SODIUM CHLORIDE 0.9 % IV SOLN: 500.0000 mL | Freq: Once | INTRAVENOUS | Status: DC

## 2018-12-25 NOTE — Progress Notes (Signed)
Called to room to assist during endoscopic procedure.  Patient ID and intended procedure confirmed with present staff. Received instructions for my participation in the procedure from the performing physician.  

## 2018-12-25 NOTE — Patient Instructions (Signed)
YOU HAD AN ENDOSCOPIC PROCEDURE TODAY AT THE [facility]:   Refer to the procedure report that was given to you for any specific questions about what was found during the examination.  If the procedure report does not answer your questions, please call your gastroenterologist to clarify.  If you requested that your care partner not be given the details of your procedure findings, then the procedure report has been included in a sealed envelope for you to review at your convenience later.  YOU SHOULD EXPECT: Some feelings of bloating in the abdomen. Passage of more gas than usual.  Walking can help get rid of the air that was put into your GI tract during the procedure and reduce the bloating. If you had a lower endoscopy (such as a colonoscopy or flexible sigmoidoscopy) you may notice spotting of blood in your stool or on the toilet paper. If you underwent a bowel prep for your procedure, you may not have a normal bowel movement for a few days.  Please Note:  You might notice some irritation and congestion in your nose or some drainage.  This is from the oxygen used during your procedure.  There is no need for concern and it should clear up in a day or so.  SYMPTOMS TO REPORT IMMEDIATELY:   Following lower endoscopy (colonoscopy or flexible sigmoidoscopy):  Excessive amounts of blood in the stool  Significant tenderness or worsening of abdominal pains  Swelling of the abdomen that is new, acute  Fever of 100F or higher  For urgent or emergent issues, a gastroenterologist can be reached at any hour by calling [phone].   DIET:  We do recommend a small meal at first, but then you may proceed to your regular diet.  Drink plenty of fluids but you should avoid alcoholic beverages for 24 hours.  ACTIVITY:  You should plan to take it easy for the rest of today and you should NOT DRIVE or use heavy machinery until tomorrow (because of the sedation medicines used during the test).     FOLLOW UP: Our staff will call the number listed on your records 48-72 hours following your procedure to check on you and address any questions or concerns that you may have regarding the information given to you following your procedure. If we do not reach you, we will leave a message.  We will attempt to reach you two times.  During this call, we will ask if you have developed any symptoms of COVID 19. If you develop any symptoms (ie: fever, flu-like symptoms, shortness of breath, cough etc.) before then, please call [phone].  If you test positive for Covid 19 in the 2 weeks post procedure, please call and report this information to us.    If any biopsies were taken you will be contacted by phone or by letter within the next 1-3 weeks.  Please call us at [phone] if you have not heard about the biopsies in 3 weeks.    SIGNATURES/CONFIDENTIALITY: You and/or your care partner have signed paperwork which will be entered into your electronic medical record.  These signatures attest to the fact that that the information above on your After Visit Summary has been reviewed and is understood.  Full responsibility of the confidentiality of this discharge information lies with you and/or your care-partner. 

## 2018-12-25 NOTE — Progress Notes (Signed)
PT taken to PACU. Monitors in place. VSS. Report given to RN. 

## 2018-12-25 NOTE — Op Note (Signed)
[place] Patient Name: Frederick Hicks Procedure Date: 12/25/2018 11:28 AM MRN: [id number] Endoscopist: Docia Chuck. Henrene Pastor , MD Age: 63 Referring MD:  Date of Birth: 10-Sep-1955 Gender: Male Account #: [id number] Procedure:                Colonoscopy with cold snare polypectomy x 3 Indications:              High risk colon cancer surveillance: Personal                            history of multiple (3 or more) adenomas. Previous                            examinations elsewhere around 2006 (2 polyps                            reported) and here December 2010 (5 polyps) Medicines:                Monitored Anesthesia Care Procedure:                Pre-Anesthesia Assessment:                           - Prior to the procedure, a History and Physical                            was performed, and patient medications and                            allergies were reviewed. The patient's tolerance of                            previous anesthesia was also reviewed. The risks                            and benefits of the procedure and the sedation                            options and risks were discussed with the patient.                            All questions were answered, and informed consent                            was obtained. Prior Anticoagulants: The patient has                            taken no previous anticoagulant or antiplatelet                            agents. ASA Grade Assessment: II - A patient with                            mild systemic disease. After reviewing the risks  and benefits, the patient was deemed in                            satisfactory condition to undergo the procedure.                           After obtaining informed consent, the colonoscope                            was passed under direct vision. Throughout the                            procedure, the patient's blood pressure, pulse, and   oxygen saturations were monitored continuously. The                            Colonoscope was introduced through the anus and                            advanced to the the cecum, identified by                            appendiceal orifice and ileocecal valve. The                            ileocecal valve, appendiceal orifice, and rectum                            were photographed. The quality of the bowel                            preparation was excellent. The colonoscopy was                            performed without difficulty. The patient tolerated                            the procedure well. The bowel preparation used was                            SUPREP via split dose instruction. Scope In: 11:31:44 AM Scope Out: 11:45:07 AM Scope Withdrawal Time: 0 hours 11 minutes 29 seconds  Total Procedure Duration: 0 hours 13 minutes 23 seconds  Findings:                 Three polyps were found in the rectum, descending                            colon and transverse colon. The polyps were 3 to 4                            mm in size. These polyps were removed with a cold  snare. Resection and retrieval were complete.                           A few small-mouthed diverticula were found in the                            left colon.                           The exam was otherwise without abnormality on                            direct and retroflexion views. Complications:            No immediate complications. Estimated blood loss:                            None. Estimated Blood Loss:     Estimated blood loss: none. Impression:               - Three 3 to 4 mm polyps in the rectum, in the                            descending colon and in the transverse colon,                            removed with a cold snare. Resected and retrieved.                           - Diverticulosis in the left colon.                           - The examination was otherwise  normal on direct                            and retroflexion views. Recommendation:           - Repeat colonoscopy in 3 or 5 years for                            surveillance, based on final pathology.                           - Patient has a contact number available for                            emergencies. The signs and symptoms of potential                            delayed complications were discussed with the                            patient. Return to normal activities tomorrow.                            Written discharge instructions were  provided to the                            patient.                           - Resume previous diet.                           - Continue present medications.                           - Await pathology results. Docia Chuck. Henrene Pastor, MD 12/25/2018 11:54:56 AM This report has been signed electronically.

## 2018-12-25 NOTE — Progress Notes (Signed)
AM- Temp CW- Vitals

## 2018-12-25 NOTE — Progress Notes (Signed)
Pt's states no medical or surgical changes since previsit or office visit. 

## 2018-12-27 ENCOUNTER — Encounter: Payer: Self-pay | Admitting: Internal Medicine

## 2018-12-27 ENCOUNTER — Telehealth: Payer: Self-pay

## 2018-12-27 ENCOUNTER — Telehealth: Payer: Self-pay | Admitting: *Deleted

## 2018-12-27 NOTE — Telephone Encounter (Signed)
Second attempt, left VM.  

## 2018-12-27 NOTE — Telephone Encounter (Signed)
Patient called back, see below:   Follow up Call-  Call back number 12/25/2018  Post procedure Call Back phone  # [phone]  Permission to leave phone message Yes  Some recent data might be hidden     Patient questions:  Do you have a fever, pain , or abdominal swelling? No. Pain Score  0 *  Have you tolerated food without any problems? Yes.    Have you been able to return to your normal activities? Yes.    Do you have any questions about your discharge instructions: Diet   No. Medications  No. Follow up visit  No.  Do you have questions or concerns about your Care? No.  Actions: * If pain score is 4 or above: No action needed, pain <4.  1. Have you developed a fever since your procedure? NO  2.   Have you had an respiratory symptoms (SOB or cough) since your procedure? NO  3.   Have you tested positive for COVID 19 since your procedure NO  4.   Have you had any family members/close contacts diagnosed with the COVID 19 since your procedure?  NO   If yes to any of these questions please route to Joylene John, RN and Alphonsa Gin, RN.

## 2018-12-27 NOTE — Telephone Encounter (Signed)
Left message on follow up call. 

## 2018-12-27 NOTE — Telephone Encounter (Signed)
Pt returned call to inform that he is doing well.

## 2019-02-13 ENCOUNTER — Other Ambulatory Visit: Payer: Self-pay | Admitting: Family Medicine

## 2019-04-06 ENCOUNTER — Other Ambulatory Visit: Payer: Self-pay | Admitting: Family Medicine

## 2019-04-29 ENCOUNTER — Other Ambulatory Visit: Payer: Self-pay | Admitting: Family Medicine

## 2019-04-29 ENCOUNTER — Ambulatory Visit (INDEPENDENT_AMBULATORY_CARE_PROVIDER_SITE_OTHER): Payer: BC Managed Care – PPO | Admitting: Family Medicine

## 2019-04-29 ENCOUNTER — Encounter: Payer: Self-pay | Admitting: Family Medicine

## 2019-04-29 ENCOUNTER — Other Ambulatory Visit: Payer: Self-pay

## 2019-04-29 VITALS — BP 132/76 | HR 62 | Temp 97.6°F | Resp 16 | Ht 73.5 in | Wt 226.0 lb

## 2019-04-29 DIAGNOSIS — B028 Zoster with other complications: Secondary | ICD-10-CM

## 2019-04-29 MED ORDER — HYDROCODONE-ACETAMINOPHEN 5-325 MG PO TABS: 1.0000 | ORAL_TABLET | Freq: Four times a day (QID) | ORAL | 0 refills | Status: DC | PRN

## 2019-04-29 MED ORDER — VALACYCLOVIR HCL 1 G PO TABS: 1000.0000 mg | ORAL_TABLET | Freq: Three times a day (TID) | ORAL | 0 refills | Status: DC

## 2019-04-29 NOTE — Telephone Encounter (Signed)
Requested Prescriptions   Pending Prescriptions Disp Refills  . HYDROcodone-acetaminophen (NORCO) 5-325 MG tablet 15 tablet 0    Sig: Take 1-2 tablets by mouth every 6 (six) hours as needed for moderate pain.    [facility] called and stated they can only receive narcotics handwritten or electronic.

## 2019-04-29 NOTE — Progress Notes (Signed)
Subjective:    Patient ID: Frederick Hicks, male    DOB: Aug 20, 1955, 64 y.o.   MRN: [id number]  HPI  Patient developed a rash on his right flank less than 2 days ago.  The rash burns and hurts constantly.  It also itches.  Rash consist of erythematous papules in a dermatomal pattern.  It starts in the midline of his back and radiates around to his mid axillary line.  There is no other rash anywhere else on his body.  Past Medical History:  Diagnosis Date  . Hiatal hernia   . Hyperlipidemia   . Hypertension    Past Surgical History:  Procedure Laterality Date  . ANKLE FRACTURE SURGERY     left, pinning  . COLONOSCOPY  04/01/2015  . POLYPECTOMY    . TONSILLECTOMY     Current Outpatient Medications on File Prior to Visit  Medication Sig Dispense Refill  . aspirin 81 MG tablet Take 81 mg by mouth daily.    Marland Kitchen atorvastatin (LIPITOR) 10 MG tablet TAKE 1 TABLET BY MOUTH EVERY DAY 90 tablet 1  . Cholecalciferol (VITAMIN D-3) 5000 units TABS Take 1 tablet by mouth daily. 30 tablet   . hydrochlorothiazide (HYDRODIURIL) 25 MG tablet TAKE 1 TABLET BY MOUTH EVERY DAY 90 tablet 1  . losartan (COZAAR) 100 MG tablet TAKE 1 TABLET BY MOUTH EVERY DAY 90 tablet 3  . valACYclovir (VALTREX) 1000 MG tablet Take 1 tablet (1,000 mg total) by mouth 3 (three) times daily. 21 tablet 0   No current facility-administered medications on file prior to visit.   Allergies  Allergen Reactions  . Lisinopril Cough  . Simvastatin Other (See Comments)    Mylagias   Social History   Socioeconomic History  . Marital status: Widowed    Spouse name: Not on file  . Number of children: Not on file  . Years of education: Not on file  . Highest education level: Not on file  Occupational History  . Not on file  Tobacco Use  . Smoking status: Former Smoker    Types: Cigars    Quit date: 04/18/1997    Years since quitting: 22.0  . Smokeless tobacco: Never Used  Substance and Sexual Activity  . Alcohol use: Yes   Alcohol/week: 3.0 standard drinks    Types: 2 Cans of beer, 1 Glasses of wine per week  . Drug use: No  . Sexual activity: Not on file  Other Topics Concern  . Not on file  Social History Narrative   Wife passed away--secondary to acute MI   Pt works--binding books   Has son and grandchildren nearby   Social Determinants of Health   Financial Resource Strain:   . Difficulty of Paying Living Expenses: Not on file  Food Insecurity:   . Worried About Charity fundraiser in the Last Year: Not on file  . Ran Out of Food in the Last Year: Not on file  Transportation Needs:   . Lack of Transportation (Medical): Not on file  . Lack of Transportation (Non-Medical): Not on file  Physical Activity:   . Days of Exercise per Week: Not on file  . Minutes of Exercise per Session: Not on file  Stress:   . Feeling of Stress : Not on file  Social Connections:   . Frequency of Communication with Friends and Family: Not on file  . Frequency of Social Gatherings with Friends and Family: Not on file  . Attends Religious Services: Not on  file  . Active Member of Clubs or Organizations: Not on file  . Attends Archivist Meetings: Not on file  . Marital Status: Not on file  Intimate Partner Violence:   . Fear of Current or Ex-Partner: Not on file  . Emotionally Abused: Not on file  . Physically Abused: Not on file  . Sexually Abused: Not on file    Review of Systems  All other systems reviewed and are negative.      Objective:   Physical Exam Vitals reviewed.  Constitutional:      Appearance: Normal appearance.  Cardiovascular:     Rate and Rhythm: Normal rate and regular rhythm.     Heart sounds: Normal heart sounds.  Pulmonary:     Effort: Pulmonary effort is normal.     Breath sounds: Normal breath sounds.  Musculoskeletal:       Back:  Skin:    Findings: Erythema and rash present.  Neurological:     Mental Status: He is alert.           Assessment & Plan:    Herpes zoster with complication - Plan: HYDROcodone-acetaminophen (NORCO) 5-325 MG tablet  Valtrex 1 g p.o. 3 times daily for 7 days.  Can use Norco 5/325 1 p.o. every 6 hours as needed pain.

## 2019-04-30 ENCOUNTER — Other Ambulatory Visit: Payer: Self-pay | Admitting: Family Medicine

## 2019-04-30 DIAGNOSIS — B028 Zoster with other complications: Secondary | ICD-10-CM

## 2019-04-30 MED ORDER — HYDROCODONE-ACETAMINOPHEN 5-325 MG PO TABS: 1.0000 | ORAL_TABLET | Freq: Four times a day (QID) | ORAL | 0 refills | Status: DC | PRN

## 2019-06-24 ENCOUNTER — Other Ambulatory Visit: Payer: Self-pay | Admitting: Family Medicine

## 2019-08-24 ENCOUNTER — Other Ambulatory Visit: Payer: Self-pay | Admitting: Family Medicine

## 2020-01-07 ENCOUNTER — Other Ambulatory Visit: Payer: Self-pay | Admitting: Family Medicine

## 2020-01-08 DIAGNOSIS — T63443S Toxic effect of venom of bees, assault, sequela: Secondary | ICD-10-CM | POA: Diagnosis not present

## 2020-04-02 ENCOUNTER — Other Ambulatory Visit: Payer: Self-pay

## 2020-04-02 MED ORDER — LOSARTAN POTASSIUM 100 MG PO TABS: 100.0000 mg | ORAL_TABLET | Freq: Every day | ORAL | 3 refills | Status: DC

## 2020-04-28 ENCOUNTER — Encounter: Payer: Medicare Other | Admitting: Family Medicine

## 2020-04-28 ENCOUNTER — Other Ambulatory Visit: Payer: Self-pay

## 2020-04-28 MED ORDER — LOSARTAN POTASSIUM 100 MG PO TABS: 100.0000 mg | ORAL_TABLET | Freq: Every day | ORAL | 3 refills | Status: DC

## 2020-05-14 ENCOUNTER — Other Ambulatory Visit: Payer: Self-pay

## 2020-05-14 ENCOUNTER — Encounter: Payer: Self-pay | Admitting: Family Medicine

## 2020-05-14 ENCOUNTER — Ambulatory Visit (INDEPENDENT_AMBULATORY_CARE_PROVIDER_SITE_OTHER): Payer: Medicare HMO | Admitting: Family Medicine

## 2020-05-14 VITALS — BP 128/68 | HR 96 | Temp 96.3°F | Ht 73.0 in | Wt 225.0 lb

## 2020-05-14 DIAGNOSIS — Z13220 Encounter for screening for lipoid disorders: Secondary | ICD-10-CM | POA: Diagnosis not present

## 2020-05-14 DIAGNOSIS — Z122 Encounter for screening for malignant neoplasm of respiratory organs: Secondary | ICD-10-CM | POA: Diagnosis not present

## 2020-05-14 DIAGNOSIS — Z136 Encounter for screening for cardiovascular disorders: Secondary | ICD-10-CM | POA: Diagnosis not present

## 2020-05-14 DIAGNOSIS — Z0000 Encounter for general adult medical examination without abnormal findings: Secondary | ICD-10-CM

## 2020-05-14 DIAGNOSIS — Z125 Encounter for screening for malignant neoplasm of prostate: Secondary | ICD-10-CM | POA: Diagnosis not present

## 2020-05-14 DIAGNOSIS — I10 Essential (primary) hypertension: Secondary | ICD-10-CM | POA: Diagnosis not present

## 2020-05-14 NOTE — Progress Notes (Signed)
Subjective:    Patient ID: Frederick Hicks, male    DOB: [birth year], 65 y.o.   MRN: [id number]  HPI Patient is a very pleasant 65 year old Caucasian male here today for complete physical exam.  His last colonoscopy was in 2020.  He is due for repeat colonoscopy in 2025 due to colon polyps.  He is due for prostate cancer screening today.  Therefore I will check a PSA.  He is also due for Pneumovax 23, flu shot, a COVID booster, and the shingles vaccine.  We discussed all these vaccinations and the patient elects to get the Covid vaccine but will defer the others for the present time.  He started smoking when he was 65 and continued smoking until age 65.  He has never had lung cancer screening but would be interested in this as he has a significant family history of lung cancer.  He also is due for a AAA screen.  Otherwise he is doing well.  He denies any falls, memory loss, or depression Past Medical History:  Diagnosis Date  . Hiatal hernia   . Hyperlipidemia   . Hypertension    Past Surgical History:  Procedure Laterality Date  . ANKLE FRACTURE SURGERY     left, pinning  . COLONOSCOPY  04/01/2015  . POLYPECTOMY    . TONSILLECTOMY     Current Outpatient Medications on File Prior to Visit  Medication Sig Dispense Refill  . aspirin 81 MG tablet Take 81 mg by mouth daily.    Marland Kitchen atorvastatin (LIPITOR) 10 MG tablet TAKE 1 TABLET BY MOUTH EVERY DAY 90 tablet 1  . Cholecalciferol (VITAMIN D-3) 5000 units TABS Take 1 tablet by mouth daily. 30 tablet   . hydrochlorothiazide (HYDRODIURIL) 25 MG tablet TAKE 1 TABLET BY MOUTH EVERY DAY 90 tablet 1  . HYDROcodone-acetaminophen (NORCO) 5-325 MG tablet Take 1-2 tablets by mouth every 6 (six) hours as needed for moderate pain. 15 tablet 0  . losartan (COZAAR) 100 MG tablet Take 1 tablet (100 mg total) by mouth daily. 90 tablet 3  . valACYclovir (VALTREX) 1000 MG tablet Take 1 tablet (1,000 mg total) by mouth 3 (three) times daily. 21 tablet 0   No current  facility-administered medications on file prior to visit.   Allergies  Allergen Reactions  . Lisinopril Cough  . Simvastatin Other (See Comments)    Mylagias   Social History   Socioeconomic History  . Marital status: Widowed    Spouse name: Not on file  . Number of children: Not on file  . Years of education: Not on file  . Highest education level: Not on file  Occupational History  . Not on file  Tobacco Use  . Smoking status: Former Smoker    Types: Cigars    Quit date: 04/18/1997    Years since quitting: 23.0  . Smokeless tobacco: Never Used  Vaping Use  . Vaping Use: Never used  Substance and Sexual Activity  . Alcohol use: Yes    Alcohol/week: 3.0 standard drinks    Types: 2 Cans of beer, 1 Glasses of wine per week  . Drug use: No  . Sexual activity: Not on file  Other Topics Concern  . Not on file  Social History Narrative   Wife passed away--secondary to acute MI   Pt works--binding books   Has son and grandchildren nearby   Social Determinants of Health   Financial Resource Strain: Not on file  Food Insecurity: Not on file  Transportation  Needs: Not on file  Physical Activity: Not on file  Stress: Not on file  Social Connections: Not on file  Intimate Partner Violence: Not on file   Family History  Problem Relation Age of Onset  . Cancer Father 53       lung 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  . Gout Maternal Uncle   . Gout Maternal Grandfather   . Colon cancer Neg Hx   . Esophageal cancer Neg Hx   . Rectal cancer Neg Hx   . Stomach cancer Neg Hx   . Colon polyps Neg Hx      Review of Systems  All other systems reviewed and are negative.      Objective:   Physical Exam Vitals reviewed.  Constitutional:      General: He is not in acute distress.    Appearance: Normal appearance. He is normal weight. He is not ill-appearing, toxic-appearing or diaphoretic.  HENT:     Head: Normocephalic and atraumatic.     Right Ear:  Tympanic membrane, ear canal and external ear normal. There is no impacted cerumen.     Left Ear: Tympanic membrane, ear canal and external ear normal. There is no impacted cerumen.     Nose: Nose normal. No congestion or rhinorrhea.     Mouth/Throat:     Mouth: Mucous membranes are moist.     Pharynx: Oropharynx is clear. No oropharyngeal exudate or posterior oropharyngeal erythema.  Eyes:     General: No scleral icterus.       Right eye: No discharge.        Left eye: No discharge.     Extraocular Movements: Extraocular movements intact.     Conjunctiva/sclera: Conjunctivae normal.     Pupils: Pupils are equal, round, and reactive to light.  Neck:     Vascular: No carotid bruit.  Cardiovascular:     Rate and Rhythm: Regular rhythm. Bradycardia present.     Pulses: Normal pulses.     Heart sounds: Normal heart sounds. No murmur heard. No friction rub. No gallop.   Pulmonary:     Effort: Pulmonary effort is normal. No respiratory distress.     Breath sounds: Normal breath sounds. No stridor. No wheezing, rhonchi or rales.  Chest:     Chest wall: No tenderness.  Abdominal:     General: Abdomen is flat. Bowel sounds are normal. There is no distension.     Palpations: Abdomen is soft. There is no mass.     Tenderness: There is no abdominal tenderness. There is no guarding or rebound.     Hernia: No hernia is present.  Genitourinary:    Prostate: Normal.     Rectum: Normal.  Musculoskeletal:        General: No swelling, tenderness, deformity or signs of injury.     Cervical back: Normal range of motion and neck supple. No rigidity. No muscular tenderness.     Right lower leg: No edema.     Left lower leg: No edema.  Lymphadenopathy:     Cervical: No cervical adenopathy.  Skin:    General: Skin is warm.     Capillary Refill: Capillary refill takes less than 2 seconds.     Coloration: Skin is not jaundiced or pale.     Findings: No bruising, erythema, lesion or rash.   Neurological:     General: No focal deficit present.     Mental Status: He is alert and oriented to person, place, and  time. Mental status is at baseline.     Cranial Nerves: No cranial nerve deficit.     Sensory: No sensory deficit.     Motor: No weakness.     Coordination: Coordination normal.     Gait: Gait normal.     Deep Tendon Reflexes: Reflexes normal.  Psychiatric:        Mood and Affect: Mood normal.        Behavior: Behavior normal.        Thought Content: Thought content normal.        Judgment: Judgment normal.           Assessment & Plan:  Prostate cancer screening - Plan: PSA  Screening cholesterol level - Plan: CBC with Differential/Platelet, COMPLETE METABOLIC PANEL WITH GFR, Lipid panel  General medical exam - Plan: CBC with Differential/Platelet, COMPLETE METABOLIC PANEL WITH GFR, Lipid panel, PSA  Encounter for abdominal aortic aneurysm (AAA) screening - Plan: VAS [place] AAA DUPLEX  Encounter for screening for lung cancer - Plan: CT CHEST LUNG CA SCREEN LOW DOSE W/O CM  Colonoscopy is up-to-date and is due again in 2025.  Screen for prostate cancer with a PSA.  Screen for lung cancer with a CT scan of the lung annually until 15 years after he is quit smoking which would be in 10 years.  Screen for AAA with a abdominal ultrasound.  Blood pressure is excellent.  Check CBC, CMP, fasting lipid panel.  The remainder of his preventative care is up-to-date.  Patient was encouraged to get the Covid booster.  Also recommended Pneumovax 23, flu shot, and the shingles vaccine which he deferred at the present time

## 2020-05-15 LAB — CBC WITH DIFFERENTIAL/PLATELET
Absolute Monocytes: 614 cells/uL (ref 200–950)
Basophils Absolute: 40 cells/uL (ref 0–200)
Basophils Relative: 0.6 %
Eosinophils Absolute: 79 cells/uL (ref 15–500)
Eosinophils Relative: 1.2 %
HCT: 43.1 % (ref 38.5–50.0)
Hemoglobin: 14.8 g/dL (ref 13.2–17.1)
Lymphs Abs: 2033 cells/uL (ref 850–3900)
MCH: 31.1 pg (ref 27.0–33.0)
MCHC: 34.3 g/dL (ref 32.0–36.0)
MCV: 90.5 fL (ref 80.0–100.0)
MPV: 9.9 fL (ref 7.5–12.5)
Monocytes Relative: 9.3 %
Neutro Abs: 3835 cells/uL (ref 1500–7800)
Neutrophils Relative %: 58.1 %
Platelets: 263 10*3/uL (ref 140–400)
RBC: 4.76 10*6/uL (ref 4.20–5.80)
RDW: 12.6 % (ref 11.0–15.0)
Total Lymphocyte: 30.8 %
WBC: 6.6 10*3/uL (ref 3.8–10.8)

## 2020-05-15 LAB — COMPLETE METABOLIC PANEL WITH GFR
AG Ratio: 2 (calc) (ref 1.0–2.5)
ALT: 28 U/L (ref 9–46)
AST: 23 U/L (ref 10–35)
Albumin: 4.4 g/dL (ref 3.6–5.1)
Alkaline phosphatase (APISO): 68 U/L (ref 35–144)
BUN: 16 mg/dL (ref 7–25)
CO2: 28 mmol/L (ref 20–32)
Calcium: 9.9 mg/dL (ref 8.6–10.3)
Chloride: 102 mmol/L (ref 98–110)
Creat: 1.16 mg/dL (ref 0.70–1.25)
GFR, Est African American: 76 mL/min/{1.73_m2} (ref >=60)
GFR, Est Non African American: 66 mL/min/{1.73_m2} (ref >=60)
Globulin: 2.2 g/dL (calc) (ref 1.9–3.7)
Glucose, Bld: 100 mg/dL — ABNORMAL HIGH (ref 65–99)
Potassium: 4.3 mmol/L (ref 3.5–5.3)
Sodium: 138 mmol/L (ref 135–146)
Total Bilirubin: 0.7 mg/dL (ref 0.2–1.2)
Total Protein: 6.6 g/dL (ref 6.1–8.1)

## 2020-05-15 LAB — LIPID PANEL
Cholesterol: 144 mg/dL (ref <200)
HDL: 33 mg/dL — ABNORMAL LOW (ref >=40)
LDL Cholesterol (Calc): 86 mg/dL (calc)
Non-HDL Cholesterol (Calc): 111 mg/dL (calc) (ref <130)
Total CHOL/HDL Ratio: 4.4 (calc) (ref <5.0)
Triglycerides: 151 mg/dL — ABNORMAL HIGH (ref <150)

## 2020-05-15 LAB — PSA: PSA: 1.82 ng/mL (ref <=4.0)

## 2020-05-26 ENCOUNTER — Ambulatory Visit (HOSPITAL_COMMUNITY)
Admission: RE | Admit: 2020-05-26 | Discharge: 2020-05-26 | Disposition: A | Discharge status: Home / Self Care | Payer: Medicare HMO | Source: Ambulatory Visit | Attending: Cardiovascular Disease | Admitting: Cardiovascular Disease

## 2020-05-26 ENCOUNTER — Other Ambulatory Visit: Payer: Self-pay

## 2020-05-26 DIAGNOSIS — Z87891 Personal history of nicotine dependence: Secondary | ICD-10-CM | POA: Diagnosis not present

## 2020-05-26 DIAGNOSIS — Z136 Encounter for screening for cardiovascular disorders: Secondary | ICD-10-CM

## 2020-05-26 DIAGNOSIS — E785 Hyperlipidemia, unspecified: Secondary | ICD-10-CM | POA: Insufficient documentation

## 2020-05-26 DIAGNOSIS — I10 Essential (primary) hypertension: Secondary | ICD-10-CM | POA: Diagnosis not present

## 2020-05-28 ENCOUNTER — Ambulatory Visit
Admission: RE | Admit: 2020-05-28 | Discharge: 2020-05-28 | Disposition: A | Discharge status: Home / Self Care | Payer: Medicare HMO | Source: Ambulatory Visit | Attending: Family Medicine | Admitting: Family Medicine

## 2020-05-28 ENCOUNTER — Other Ambulatory Visit: Payer: Self-pay

## 2020-05-28 DIAGNOSIS — R911 Solitary pulmonary nodule: Secondary | ICD-10-CM | POA: Diagnosis not present

## 2020-05-28 DIAGNOSIS — Z122 Encounter for screening for malignant neoplasm of respiratory organs: Secondary | ICD-10-CM

## 2020-05-28 DIAGNOSIS — Z87891 Personal history of nicotine dependence: Secondary | ICD-10-CM | POA: Diagnosis not present

## 2020-05-28 DIAGNOSIS — J8410 Pulmonary fibrosis, unspecified: Secondary | ICD-10-CM | POA: Diagnosis not present

## 2020-05-28 DIAGNOSIS — I2510 Atherosclerotic heart disease of native coronary artery without angina pectoris: Secondary | ICD-10-CM | POA: Diagnosis not present

## 2020-07-01 ENCOUNTER — Other Ambulatory Visit: Payer: Self-pay | Admitting: Family Medicine

## 2020-09-03 ENCOUNTER — Ambulatory Visit (INDEPENDENT_AMBULATORY_CARE_PROVIDER_SITE_OTHER): Payer: Medicare HMO

## 2020-09-03 ENCOUNTER — Other Ambulatory Visit: Payer: Self-pay

## 2020-09-03 VITALS — BP 110/60 | HR 58 | Temp 98.0°F | Ht 73.0 in | Wt 229.5 lb

## 2020-09-03 DIAGNOSIS — Z23 Encounter for immunization: Secondary | ICD-10-CM

## 2020-09-03 DIAGNOSIS — Z0000 Encounter for general adult medical examination without abnormal findings: Secondary | ICD-10-CM

## 2020-09-03 NOTE — Patient Instructions (Signed)
Frederick Hicks , Thank you for taking time to come for your Medicare Wellness Visit. I appreciate your ongoing commitment to your health goals. Please review the following plan we discussed and let me know if I can assist you in the future.   Screening recommendations/referrals: Colonoscopy: Up to date, next due 12/25/2023 Recommended yearly ophthalmology/optometry visit for glaucoma screening and checkup Recommended yearly dental visit for hygiene and checkup  Vaccinations: Influenza vaccine: Currently due, you may await next fall to receive  Pneumococcal vaccine: Completed first dose at this visit  Tdap vaccine: Up to date, next due 02/26/2025 Shingles vaccine: Currently due for Shingrix, if you would like to receive we recommend that you get at your pharmacy.    Advanced directives: Advance directive discussed with you today. Even though you declined this today please call our office should you change your mind and we can give you the proper paperwork for you to fill out.   Conditions/risks identified: None   Next appointment: 09/10/2021 @ 2 PM with [place] 65 Years and Older, Male Preventive care refers to lifestyle choices and visits with your health care provider that can promote health and wellness. What does preventive care include?  A yearly physical exam. This is also called an annual well check.  Dental exams once or twice a year.  Routine eye exams. Ask your health care provider how often you should have your eyes checked.  Personal lifestyle choices, including:  Daily care of your teeth and gums.  Regular physical activity.  Eating a healthy diet.  Avoiding tobacco and drug use.  Limiting alcohol use.  Practicing safe sex.  Taking low doses of aspirin every day.  Taking vitamin and mineral supplements as recommended by your health care provider. What happens during an annual well check? The services and screenings done by your  health care provider during your annual well check will depend on your age, overall health, lifestyle risk factors, and family history of disease. Counseling  Your health care provider may ask you questions about your:  Alcohol use.  Tobacco use.  Drug use.  Emotional well-being.  Home and relationship well-being.  Sexual activity.  Eating habits.  History of falls.  Memory and ability to understand (cognition).  Work and work Statistician. Screening  You may have the following tests or measurements:  Height, weight, and BMI.  Blood pressure.  Lipid and cholesterol levels. These may be checked every 5 years, or more frequently if you are over 21 years old.  Skin check.  Lung cancer screening. You may have this screening every year starting at age 41 if you have a 30-pack-year history of smoking and currently smoke or have quit within the past 15 years.  Fecal occult blood test (FOBT) of the stool. You may have this test every year starting at age 77.  Flexible sigmoidoscopy or colonoscopy. You may have a sigmoidoscopy every 5 years or a colonoscopy every 10 years starting at age 6.  Prostate cancer screening. Recommendations will vary depending on your family history and other risks.  Hepatitis C blood test.  Hepatitis B blood test.  Sexually transmitted disease (STD) testing.  Diabetes screening. This is done by checking your blood sugar (glucose) after you have not eaten for a while (fasting). You may have this done every 1-3 years.  Abdominal aortic aneurysm (AAA) screening. You may need this if you are a current or former smoker.  Osteoporosis. You may be screened starting  at age 43 if you are at high risk. Talk with your health care provider about your test results, treatment options, and if necessary, the need for more tests. Vaccines  Your health care provider may recommend certain vaccines, such as:  Influenza vaccine. This is recommended every  year.  Tetanus, diphtheria, and acellular pertussis (Tdap, Td) vaccine. You may need a Td booster every 10 years.  Zoster vaccine. You may need this after age 51.  Pneumococcal 13-valent conjugate (PCV13) vaccine. One dose is recommended after age 42.  Pneumococcal polysaccharide (PPSV23) vaccine. One dose is recommended after age 45. Talk to your health care provider about which screenings and vaccines you need and how often you need them. This information is not intended to replace advice given to you by your health care provider. Make sure you discuss any questions you have with your health care provider. Document Released: 05/01/2015 Document Revised: 12/23/2015 Document Reviewed: 02/03/2015 Elsevier Interactive Patient Education  2017 [place] Prevention in the Home Falls can cause injuries. They can happen to people of all ages. There are many things you can do to make your home safe and to help prevent falls. What can I do on the outside of my home?  Regularly fix the edges of walkways and driveways and fix any cracks.  Remove anything that might make you trip as you walk through a door, such as a raised step or threshold.  Trim any bushes or trees on the path to your home.  Use bright outdoor lighting.  Clear any walking paths of anything that might make someone trip, such as rocks or tools.  Regularly check to see if handrails are loose or broken. Make sure that both sides of any steps have handrails.  Any raised decks and porches should have guardrails on the edges.  Have any leaves, snow, or ice cleared regularly.  Use sand or salt on walking paths during winter.  Clean up any spills in your garage right away. This includes oil or grease spills. What can I do in the bathroom?  Use night lights.  Install grab bars by the toilet and in the tub and shower. Do not use towel bars as grab bars.  Use non-skid mats or decals in the tub or shower.  If you  need to sit down in the shower, use a plastic, non-slip stool.  Keep the floor dry. Clean up any water that spills on the floor as soon as it happens.  Remove soap buildup in the tub or shower regularly.  Attach bath mats securely with double-sided non-slip rug tape.  Do not have throw rugs and other things on the floor that can make you trip. What can I do in the bedroom?  Use night lights.  Make sure that you have a light by your bed that is easy to reach.  Do not use any sheets or blankets that are too big for your bed. They should not hang down onto the floor.  Have a firm chair that has side arms. You can use this for support while you get dressed.  Do not have throw rugs and other things on the floor that can make you trip. What can I do in the kitchen?  Clean up any spills right away.  Avoid walking on wet floors.  Keep items that you use a lot in easy-to-reach places.  If you need to reach something above you, use a strong step stool that has a grab bar.  Keep electrical cords out of the way.  Do not use floor polish or wax that makes floors slippery. If you must use wax, use non-skid floor wax.  Do not have throw rugs and other things on the floor that can make you trip. What can I do with my stairs?  Do not leave any items on the stairs.  Make sure that there are handrails on both sides of the stairs and use them. Fix handrails that are broken or loose. Make sure that handrails are as long as the stairways.  Check any carpeting to make sure that it is firmly attached to the stairs. Fix any carpet that is loose or worn.  Avoid having throw rugs at the top or bottom of the stairs. If you do have throw rugs, attach them to the floor with carpet tape.  Make sure that you have a light switch at the top of the stairs and the bottom of the stairs. If you do not have them, ask someone to add them for you. What else can I do to help prevent falls?  Wear shoes  that:  Do not have high heels.  Have rubber bottoms.  Are comfortable and fit you well.  Are closed at the toe. Do not wear sandals.  If you use a stepladder:  Make sure that it is fully opened. Do not climb a closed stepladder.  Make sure that both sides of the stepladder are locked into place.  Ask someone to hold it for you, if possible.  Clearly mark and make sure that you can see:  Any grab bars or handrails.  First and last steps.  Where the edge of each step is.  Use tools that help you move around (mobility aids) if they are needed. These include:  Canes.  Walkers.  Scooters.  Crutches.  Turn on the lights when you go into a dark area. Replace any light bulbs as soon as they burn out.  Set up your furniture so you have a clear path. Avoid moving your furniture around.  If any of your floors are uneven, fix them.  If there are any pets around you, be aware of where they are.  Review your medicines with your doctor. Some medicines can make you feel dizzy. This can increase your chance of falling. Ask your doctor what other things that you can do to help prevent falls. This information is not intended to replace advice given to you by your health care provider. Make sure you discuss any questions you have with your health care provider. Document Released: 01/29/2009 Document Revised: 09/10/2015 Document Reviewed: 05/09/2014 Elsevier Interactive Patient Education  2017 [employer].

## 2020-09-03 NOTE — Progress Notes (Signed)
Subjective:   Frederick Hicks is a 65 y.o. male who presents for an Initial Medicare Annual Wellness Visit.  Review of Systems    N/A  Cardiac Risk Factors include: advanced age (>39men, >32 women);male gender;hypertension;dyslipidemia     Objective:    Today's Vitals   09/03/20 1414  BP: 110/60  Pulse: (!) 58  Temp: 98 F (36.7 C)  TempSrc: Oral  SpO2: 98%  Weight: 229 lb 8 oz (104.1 kg)  Height: 6\' 1"  (1.854 m)   Body mass index is 30.28 kg/m.  Advanced Directives 09/03/2020 04/01/2015 03/19/2015  Does Patient Have a Medical Advance Directive? No No No  Would patient like information on creating a medical advance directive? No - Patient declined No - patient declined information No - patient declined information    Current Medications (verified) Outpatient Encounter Medications as of 09/03/2020  Medication Sig  . aspirin 81 MG tablet Take 81 mg by mouth daily.  Marland Kitchen atorvastatin (LIPITOR) 10 MG tablet TAKE 1 TABLET BY MOUTH EVERY DAY  . hydrochlorothiazide (HYDRODIURIL) 25 MG tablet TAKE 1 TABLET BY MOUTH EVERY DAY  . losartan (COZAAR) 100 MG tablet Take 1 tablet (100 mg total) by mouth daily.  . Cholecalciferol (VITAMIN D-3) 5000 units TABS Take 1 tablet by mouth daily. (Patient not taking: Reported on 09/03/2020)  . [DISCONTINUED] HYDROcodone-acetaminophen (NORCO) 5-325 MG tablet Take 1-2 tablets by mouth every 6 (six) hours as needed for moderate pain.  . [DISCONTINUED] valACYclovir (VALTREX) 1000 MG tablet Take 1 tablet (1,000 mg total) by mouth 3 (three) times daily.   No facility-administered encounter medications on file as of 09/03/2020.    Allergies (verified) Lisinopril and Simvastatin   History: Past Medical History:  Diagnosis Date  . Hiatal hernia   . Hyperlipidemia   . Hypertension    Past Surgical History:  Procedure Laterality Date  . ANKLE FRACTURE SURGERY     left, pinning  . COLONOSCOPY  04/01/2015  . POLYPECTOMY    . TONSILLECTOMY     Family  History  Problem Relation Age of Onset  . Cancer Father 73       lung 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  . Gout Maternal Uncle   . Gout Maternal Grandfather   . Colon cancer Neg Hx   . Esophageal cancer Neg Hx   . Rectal cancer Neg Hx   . Stomach cancer Neg Hx   . Colon polyps Neg Hx    Social History   Socioeconomic History  . Marital status: Widowed    Spouse name: Not on file  . Number of children: Not on file  . Years of education: Not on file  . Highest education level: Not on file  Occupational History  . Not on file  Tobacco Use  . Smoking status: Former Smoker    Types: Cigars    Quit date: 04/18/1997    Years since quitting: 23.3  . Smokeless tobacco: Never Used  Vaping Use  . Vaping Use: Never used  Substance and Sexual Activity  . Alcohol use: Yes    Alcohol/week: 3.0 standard drinks    Types: 2 Cans of beer, 1 Glasses of wine per week  . Drug use: No  . Sexual activity: Not on file  Other Topics Concern  . Not on file  Social History Narrative   Wife passed away--secondary to acute MI   Pt works--binding books   Has son and grandchildren nearby   Social Determinants of Health   Financial  Resource Strain: Low Risk   . Difficulty of Paying Living Expenses: Not hard at all  Food Insecurity: No Food Insecurity  . Worried About Charity fundraiser in the Last Year: Never true  . Ran Out of Food in the Last Year: Never true  Transportation Needs: No Transportation Needs  . Lack of Transportation (Medical): No  . Lack of Transportation (Non-Medical): No  Physical Activity: Sufficiently Active  . Days of Exercise per Week: 5 days  . Minutes of Exercise per Session: 30 min  Stress: No Stress Concern Present  . Feeling of Stress : Not at all  Social Connections: Moderately Integrated  . Frequency of Communication with Friends and Family: Three times a week  . Frequency of Social Gatherings with Friends and Family: More than three times a week   . Attends Religious Services: More than 4 times per year  . Active Member of Clubs or Organizations: Yes  . Attends Archivist Meetings: More than 4 times per year  . Marital Status: Widowed    Tobacco Counseling Counseling given: Not Answered   Clinical Intake:  Pre-visit preparation completed: Yes  Pain : No/denies pain     Nutritional Risks: Nausea/ vomitting/ diarrhea (nausea one week) Diabetes: No  How often do you need to have someone help you when you read instructions, pamphlets, or other written materials from your doctor or pharmacy?: 1 - Never  Diabetic? No   Interpreter Needed?: No  Information entered by :: [place] of Daily Living In your present state of health, do you have any difficulty performing the following activities: 09/03/2020 05/14/2020  Hearing? N N  Vision? Y N  Comment some blurred vision to right eye -  Difficulty concentrating or making decisions? N N  Walking or climbing stairs? N N  Dressing or bathing? N N  Doing errands, shopping? N N  Preparing Food and eating ? N -  Using the Toilet? N -  In the past six months, have you accidently leaked urine? N -  Do you have problems with loss of bowel control? N -  Managing your Medications? N -  Managing your Finances? N -  Housekeeping or managing your Housekeeping? N -  Some recent data might be hidden    Patient Care Team: Susy Frizzle, MD as PCP - General (Family Medicine)  Indicate any recent Medical Services you may have received from other than [facility] providers in the past year (date may be approximate).     Assessment:   This is a routine wellness examination for Frederick Hicks.  Hearing/Vision screen  Hearing Screening   125Hz  250Hz  500Hz  1000Hz  2000Hz  3000Hz  4000Hz  6000Hz  8000Hz   Right ear:           Left ear:           Vision Screening Comments: Patient has not had an eye exam since start of the pandemic. Currently wearing glasses   Dietary issues  and exercise activities discussed: Current Exercise Habits: The patient has a physically strenuous job, but has no regular exercise apart from work., Exercise limited by: None identified  Goals Addressed            This Visit's Progress   . Patient Stated       I would like to maintain my current health       Depression Screen PHQ 2/9 Scores 09/03/2020 05/14/2020 11/20/2018 11/16/2017 03/23/2017 07/29/2016  PHQ - 2 Score 0 0 0 0 0  0  PHQ- 9 Score - - - - - 0    Fall Risk Fall Risk  09/03/2020 05/14/2020 11/16/2017 03/23/2017 07/29/2016  Falls in the past year? 0 0 No No No  Number falls in past yr: 0 0 - - -  Injury with Fall? 0 0 - - -  Risk for fall due to : No Fall Risks - - - -  Follow up Falls evaluation completed;Falls prevention discussed Falls evaluation completed - - -    FALL RISK PREVENTION PERTAINING TO THE HOME:  Any stairs in or around the home? Yes  If so, are there any without handrails? No  Home free of loose throw rugs in walkways, pet beds, electrical cords, etc? Yes  Adequate lighting in your home to reduce risk of falls? Yes   ASSISTIVE DEVICES UTILIZED TO PREVENT FALLS:  Life alert? No  Use of a cane, walker or w/c? No  Grab bars in the bathroom? No  Shower chair or bench in shower? No  Elevated toilet seat or a handicapped toilet? No   TIMED UP AND GO:  Was the test performed? Yes .  Length of time to ambulate 10 feet: 3 sec.   Gait steady and fast without use of assistive device  Cognitive Function:   Normal cognitive status assessed by direct observation by this Nurse Health Advisor. No abnormalities found.        Immunizations Immunization History  Administered Date(s) Administered  . Influenza,inj,Quad PF,6+ Mos 02/27/2015, 05/09/2016, 03/23/2017, 05/07/2018  . PFIZER Comirnaty(Gray Top)Covid-19 Tri-Sucrose Vaccine 06/26/2019, 07/17/2019, 05/15/2020  . Tdap 02/27/2015    TDAP status: Up to date  Flu Vaccine status: Due, Education has been  provided regarding the importance of this vaccine. Advised may receive this vaccine at local pharmacy or Health Dept. Aware to provide a copy of the vaccination record if obtained from local pharmacy or Health Dept. Verbalized acceptance and understanding.  Pneumococcal vaccine status: Up to date  Covid-19 vaccine status: Completed vaccines  Qualifies for Shingles Vaccine? Yes   Zostavax completed No   Shingrix Completed?: No.    Education has been provided regarding the importance of this vaccine. Patient has been advised to call insurance company to determine out of pocket expense if they have not yet received this vaccine. Advised may also receive vaccine at local pharmacy or Health Dept. Verbalized acceptance and understanding.  Screening Tests Health Maintenance  Topic Date Due  . Hepatitis C Screening  Never done  . PNA vac Low Risk Adult (1 of 2 - PCV13) Never done  . INFLUENZA VACCINE  11/16/2020  . COLONOSCOPY (Pts 45-44yrs Insurance coverage will need to be confirmed)  12/25/2023  . TETANUS/TDAP  02/26/2025  . COVID-19 Vaccine  Completed  . HPV VACCINES  Aged Out    Health Maintenance  Health Maintenance Due  Topic Date Due  . Hepatitis C Screening  Never done  . PNA vac Low Risk Adult (1 of 2 - PCV13) Never done    Colorectal cancer screening: Type of screening: Colonoscopy. Completed 12/25/2018. Repeat every 5 years  Lung Cancer Screening: (Low Dose CT Chest recommended if Age 40-80 years, 30 pack-year currently smoking OR have quit w/in 15years.) does qualify.   Lung Cancer Screening Referral: N/A   Additional Screening:  Hepatitis C Screening: does qualify; Completed   Vision Screening: Recommended annual ophthalmology exams for early detection of glaucoma and other disorders of the eye. Is the patient up to date with their annual eye  exam?  No  Who is the provider or what is the name of the office in which the patient attends annual eye exams? [facility]   If pt is not established with a provider, would they like to be referred to a provider to establish care? No .   Dental Screening: Recommended annual dental exams for proper oral hygiene  Community Resource Referral / Chronic Care Management: CRR required this visit?  No   CCM required this visit?  No      Plan:     I have personally reviewed and noted the following in the patient's chart:   . Medical and social history . Use of alcohol, tobacco or illicit drugs  . Current medications and supplements including opioid prescriptions. Patient is not currently taking opioid prescriptions. . Functional ability and status . Nutritional status . Physical activity . Advanced directives . List of other physicians . Hospitalizations, surgeries, and ER visits in previous 12 months . Vitals . Screenings to include cognitive, depression, and falls . Referrals and appointments  In addition, I have reviewed and discussed with patient certain preventive protocols, quality metrics, and best practice recommendations. A written personalized care plan for preventive services as well as general preventive health recommendations were provided to patient.     Ofilia Neas, LPN   6/33/[id number]   Nurse Notes: None

## 2020-09-04 NOTE — Addendum Note (Signed)
Addended by: Ofilia Neas R on: 09/04/2020 11:13 AM   Modules accepted: Orders

## 2020-10-14 ENCOUNTER — Other Ambulatory Visit: Payer: Self-pay | Admitting: *Deleted

## 2020-10-14 MED ORDER — SILDENAFIL CITRATE 100 MG PO TABS: 50.0000 mg | ORAL_TABLET | Freq: Every day | ORAL | 11 refills | Status: DC | PRN

## 2020-11-20 ENCOUNTER — Ambulatory Visit (INDEPENDENT_AMBULATORY_CARE_PROVIDER_SITE_OTHER): Payer: Medicare HMO | Admitting: Family Medicine

## 2020-11-20 ENCOUNTER — Other Ambulatory Visit: Payer: Self-pay

## 2020-11-20 ENCOUNTER — Encounter: Payer: Self-pay | Admitting: Family Medicine

## 2020-11-20 VITALS — BP 130/64 | HR 62 | Temp 98.2°F | Resp 16 | Ht 73.0 in | Wt 221.0 lb

## 2020-11-20 DIAGNOSIS — R5382 Chronic fatigue, unspecified: Secondary | ICD-10-CM

## 2020-11-20 DIAGNOSIS — R7309 Other abnormal glucose: Secondary | ICD-10-CM | POA: Diagnosis not present

## 2020-11-20 DIAGNOSIS — Z125 Encounter for screening for malignant neoplasm of prostate: Secondary | ICD-10-CM | POA: Diagnosis not present

## 2020-11-20 DIAGNOSIS — R634 Abnormal weight loss: Secondary | ICD-10-CM

## 2020-11-20 MED ORDER — TADALAFIL 20 MG PO TABS: 10.0000 mg | ORAL_TABLET | ORAL | 11 refills | Status: DC | PRN

## 2020-11-20 NOTE — Progress Notes (Signed)
Subjective:    Patient ID: Frederick Hicks, male    DOB: 03/31/1956, 66 y.o.   MRN: [id number]  HPI Patient is now engaged to be married.  He is very happy.  He is here today with his fiance.  She seems like a very nice lady and is a retired Marine scientist.  However, he has been dealing with severe fatigue.  She eats very healthy.  She is also got him to start eating healthy and cooking healthy.  He is also given up alcohol.  As result he is lost some weight.  He is also doing a lot of work outside in the heat and he believes that he is sweating a lot.  Therefore I am concerned about dehydration and hypotension.  Patient reports erectile problems.  Viagra 100 mg gave him no benefit.  He also complains of fatigue lack of energy and decreased libido.  He denies any fevers or chills or chest pain.  He denies any bruising.  He denies any melena or hematochezia.  He denies any abdominal pain nausea or vomiting Past Medical History:  Diagnosis Date   Hiatal hernia    Hyperlipidemia    Hypertension    Past Surgical History:  Procedure Laterality Date   ANKLE FRACTURE SURGERY     left, pinning   COLONOSCOPY  04/01/2015   POLYPECTOMY     TONSILLECTOMY     Current Outpatient Medications on File Prior to Visit  Medication Sig Dispense Refill   aspirin 81 MG tablet Take 81 mg by mouth daily.     atorvastatin (LIPITOR) 10 MG tablet TAKE 1 TABLET BY MOUTH EVERY DAY 90 tablet 1   hydrochlorothiazide (HYDRODIURIL) 25 MG tablet TAKE 1 TABLET BY MOUTH EVERY DAY 90 tablet 1   losartan (COZAAR) 100 MG tablet Take 1 tablet (100 mg total) by mouth daily. 90 tablet 3   sildenafil (VIAGRA) 100 MG tablet Take 0.5-1 tablets (50-100 mg total) by mouth daily as needed for erectile dysfunction (Take (1) hour prior to activity). 30 tablet 11   No current facility-administered medications on file prior to visit.   Allergies  Allergen Reactions   Lisinopril Cough   Simvastatin     Mylagias   Social History    Socioeconomic History   Marital status: Widowed    Spouse name: Not on file   Number of children: Not on file   Years of education: Not on file   Highest education level: Not on file  Occupational History   Not on file  Tobacco Use   Smoking status: Former    Types: Cigars    Quit date: 04/18/1997    Years since quitting: 23.6   Smokeless tobacco: Never  Vaping Use   Vaping Use: Never used  Substance and Sexual Activity   Alcohol use: Yes    Alcohol/week: 3.0 standard drinks    Types: 2 Cans of beer, 1 Glasses of wine per week   Drug use: No   Sexual activity: Not on file  Other Topics Concern   Not on file  Social History Narrative   Wife passed away--secondary to acute MI   Pt works--binding books   Has son and grandchildren nearby   Social Determinants of Health   Financial Resource Strain: Low Risk    Difficulty of Paying Living Expenses: Not hard at all  Food Insecurity: No Food Insecurity   Worried About Charity fundraiser in the Last Year: Never true   [employer] of  Food in the Last Year: Never true  Transportation Needs: No Transportation Needs   Lack of Transportation (Medical): No   Lack of Transportation (Non-Medical): No  Physical Activity: Sufficiently Active   Days of Exercise per Week: 5 days   Minutes of Exercise per Session: 30 min  Stress: No Stress Concern Present   Feeling of Stress : Not at all  Social Connections: Moderately Integrated   Frequency of Communication with Friends and Family: Three times a week   Frequency of Social Gatherings with Friends and Family: More than three times a week   Attends Religious Services: More than 4 times per year   Active Member of Clubs or Organizations: Yes   Attends Archivist Meetings: More than 4 times per year   Marital Status: Widowed  Human resources officer Violence: Not At Risk   Fear of Current or Ex-Partner: No   Emotionally Abused: No   Physically Abused: No   Sexually Abused: No    Family History  Problem Relation Age of Onset   Cancer Father 64       lung 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  Constitutional:      Appearance: Normal appearance. He is normal weight.  Neck:     Vascular: No carotid bruit.  Cardiovascular:     Rate and Rhythm: Normal rate and regular rhythm.     Heart sounds: Normal heart sounds.  Pulmonary:     Effort: Pulmonary effort is normal.     Breath sounds: Normal breath sounds.  Musculoskeletal:     Cervical back: Neck supple.     Right lower leg: No edema.     Left lower leg: No edema.  Lymphadenopathy:     Cervical: No cervical adenopathy.  Neurological:     Mental Status: He is alert.          Assessment & Plan:  Chronic fatigue - Plan: CBC with Differential/Platelet, COMPLETE METABOLIC PANEL WITH GFR, TSH, PSA, Testosterone Total,Free,Bio, Males, Hemoglobin A1c I am concerned that the patient may be hypotensive due to dehydration, weight loss, and overmedication.  Therefore of asked him to discontinue hydrochlorothiazide.  This may also be contributing some to his erectile dysfunction.  I will ask his fiance to check his blood pressure numerous times daily and record the values and report them to me in 1 to 2 weeks to determine if we need to replace hydrochlorothiazide with something different such as losartan.  I will also work-up his fatigue by checking a CBC to rule out anemia, checking a CMP, TSH, a testosterone level along with an A1c.  While checking lab work I will screen for prostate cancer with a PSA.  We will switch patient from Viagra to Cialis to see if this works better for him.  If not we may need to consult urology especially if his testosterone level  is normal and discontinuing hydrochlorothiazide does not help.

## 2020-11-21 LAB — CBC WITH DIFFERENTIAL/PLATELET
Absolute Monocytes: 626 cells/uL (ref 200–950)
Basophils Absolute: 43 cells/uL (ref 0–200)
Basophils Relative: 0.6 %
Eosinophils Absolute: 130 cells/uL (ref 15–500)
Eosinophils Relative: 1.8 %
HCT: 43.8 % (ref 38.5–50.0)
Hemoglobin: 14.2 g/dL (ref 13.2–17.1)
Lymphs Abs: 1843 cells/uL (ref 850–3900)
MCH: 30.1 pg (ref 27.0–33.0)
MCHC: 32.4 g/dL (ref 32.0–36.0)
MCV: 92.8 fL (ref 80.0–100.0)
MPV: 10.1 fL (ref 7.5–12.5)
Monocytes Relative: 8.7 %
Neutro Abs: 4558 cells/uL (ref 1500–7800)
Neutrophils Relative %: 63.3 %
Platelets: 271 10*3/uL (ref 140–400)
RBC: 4.72 10*6/uL (ref 4.20–5.80)
RDW: 12.9 % (ref 11.0–15.0)
Total Lymphocyte: 25.6 %
WBC: 7.2 10*3/uL (ref 3.8–10.8)

## 2020-11-21 LAB — TESTOSTERONE TOTAL,FREE,BIO, MALES
Albumin: 4.3 g/dL (ref 3.6–5.1)
Sex Hormone Binding: 24 nmol/L (ref 22–77)
Testosterone, Bioavailable: 147.2 ng/dL (ref 110.0–575.0)
Testosterone, Free: 74.7 pg/mL (ref 46.0–224.0)
Testosterone: 432 ng/dL (ref 250–827)

## 2020-11-21 LAB — COMPLETE METABOLIC PANEL WITH GFR
AG Ratio: 1.7 (calc) (ref 1.0–2.5)
ALT: 20 U/L (ref 9–46)
AST: 19 U/L (ref 10–35)
Albumin: 4.3 g/dL (ref 3.6–5.1)
Alkaline phosphatase (APISO): 60 U/L (ref 35–144)
BUN: 15 mg/dL (ref 7–25)
CO2: 28 mmol/L (ref 20–32)
Calcium: 9.6 mg/dL (ref 8.6–10.3)
Chloride: 102 mmol/L (ref 98–110)
Creat: 1.06 mg/dL (ref 0.70–1.35)
Globulin: 2.6 g/dL (calc) (ref 1.9–3.7)
Glucose, Bld: 91 mg/dL (ref 65–99)
Potassium: 4.1 mmol/L (ref 3.5–5.3)
Sodium: 138 mmol/L (ref 135–146)
Total Bilirubin: 0.6 mg/dL (ref 0.2–1.2)
Total Protein: 6.9 g/dL (ref 6.1–8.1)
eGFR: 77 mL/min/{1.73_m2} (ref >=60)

## 2020-11-21 LAB — TSH: TSH: 1.58 mIU/L (ref 0.40–4.50)

## 2020-11-21 LAB — HEMOGLOBIN A1C
Hgb A1c MFr Bld: 5.7 % of total Hgb — ABNORMAL HIGH (ref <5.7)
Mean Plasma Glucose: 117 mg/dL
eAG (mmol/L): 6.5 mmol/L

## 2020-11-21 LAB — PSA: PSA: 2.86 ng/mL (ref <=4.00)

## 2020-11-23 ENCOUNTER — Other Ambulatory Visit: Payer: Self-pay | Admitting: Family Medicine

## 2020-12-04 DIAGNOSIS — H02886 Meibomian gland dysfunction of left eye, unspecified eyelid: Secondary | ICD-10-CM | POA: Diagnosis not present

## 2020-12-04 DIAGNOSIS — H02883 Meibomian gland dysfunction of right eye, unspecified eyelid: Secondary | ICD-10-CM | POA: Diagnosis not present

## 2020-12-04 DIAGNOSIS — Z0100 Encounter for examination of eyes and vision without abnormal findings: Secondary | ICD-10-CM | POA: Diagnosis not present

## 2020-12-04 DIAGNOSIS — H25013 Cortical age-related cataract, bilateral: Secondary | ICD-10-CM | POA: Diagnosis not present

## 2020-12-14 DIAGNOSIS — Z87898 Personal history of other specified conditions: Secondary | ICD-10-CM | POA: Diagnosis not present

## 2020-12-14 DIAGNOSIS — L209 Atopic dermatitis, unspecified: Secondary | ICD-10-CM | POA: Diagnosis not present

## 2020-12-15 ENCOUNTER — Other Ambulatory Visit: Payer: Self-pay | Admitting: Family Medicine

## 2020-12-15 DIAGNOSIS — N529 Male erectile dysfunction, unspecified: Secondary | ICD-10-CM

## 2020-12-16 ENCOUNTER — Telehealth: Payer: Self-pay | Admitting: *Deleted

## 2020-12-16 NOTE — Telephone Encounter (Signed)
Received list of BP readings that PCP reviewed.   Per PCP, BP reading are excellent and no changes will be made.   Call placed to patient and patient made aware.

## 2020-12-23 ENCOUNTER — Other Ambulatory Visit: Payer: Self-pay

## 2020-12-23 ENCOUNTER — Encounter: Payer: Self-pay | Admitting: Urology

## 2020-12-23 ENCOUNTER — Ambulatory Visit (INDEPENDENT_AMBULATORY_CARE_PROVIDER_SITE_OTHER): Payer: Medicare HMO | Admitting: Urology

## 2020-12-23 VITALS — BP 160/82 | HR 65 | Ht 73.5 in | Wt 217.0 lb

## 2020-12-23 DIAGNOSIS — N529 Male erectile dysfunction, unspecified: Secondary | ICD-10-CM

## 2020-12-23 DIAGNOSIS — Z125 Encounter for screening for malignant neoplasm of prostate: Secondary | ICD-10-CM | POA: Diagnosis not present

## 2020-12-23 MED ORDER — TADALAFIL 20 MG PO TABS: 20.0000 mg | ORAL_TABLET | Freq: Every day | ORAL | 11 refills | Status: DC

## 2020-12-23 NOTE — Patient Instructions (Signed)
Erectile Dysfunction Erectile dysfunction (ED) is the inability to get or keep an erection in order to have sexual intercourse. ED is considered a symptom of an underlying disorder and is not considered a disease. ED may include: Inability to get an erection. Lack of enough hardness of the erection to allow penetration. Loss of erection before sex is finished. What are the causes? This condition may be caused by: Physical causes, such as: Artery problems. This may include heart disease, high blood pressure, atherosclerosis, and diabetes. Hormonal problems, such as low testosterone. Obesity. Nerve problems. This may include back or pelvic injuries, multiple sclerosis, Parkinson's disease, spinal cord injury, and stroke. Certain medicines, such as: Pain relievers. Antidepressants. Blood pressure medicines and water pills (diuretics). Cancer medicines. Antihistamines. Muscle relaxants. Lifestyle factors, such as: Use of drugs such as marijuana, cocaine, or opioids. Excessive use of alcohol. Smoking. Lack of physical activity or exercise. Psychological causes, such as: Anxiety or stress. Sadness or depression. Exhaustion. Fear about sexual performance. Guilt. What are the signs or symptoms? Symptoms of this condition include: Inability to get an erection. Lack of enough hardness of the erection to allow penetration. Loss of the erection before sex is finished. Sometimes having normal erections, but with frequent unsatisfactory episodes. Low sexual satisfaction in either partner due to erection problems. A curved penis occurring with erection. The curve may cause pain, or the penis may be too curved to allow for intercourse. Never having nighttime or morning erections. How is this diagnosed? This condition is often diagnosed by: Performing a physical exam to find other diseases or specific problems with the penis. Asking you detailed questions about the problem. Doing tests,  such as: Blood tests to check for diabetes mellitus or high cholesterol, or to measure hormone levels. Other tests to check for underlying health conditions. An ultrasound exam to check for scarring. A test to check blood flow to the penis. Doing a sleep study at home to measure nighttime erections. How is this treated? This condition may be treated by: Medicines, such as: Medicine taken by mouth to help you achieve an erection (oral medicine). Hormone replacement therapy to replace low testosterone levels. Medicine that is injected into the penis. Your health care provider may instruct you how to give yourself these injections at home. Medicine that is delivered with a short applicator tube. The tube is inserted into the opening at the tip of the penis, which is the opening of the urethra. A tiny pellet of medicine is put in the urethra. The pellet dissolves and enhances erectile function. This is also called MUSE (medicated urethral system for erections) therapy. Vacuum pump. This is a pump with a ring on it. The pump and ring are placed on the penis and used to create pressure that helps the penis become erect. Penile implant surgery. In this procedure, you may receive: An inflatable implant. This consists of cylinders, a pump, and a reservoir. The cylinders can be inflated with a fluid that helps to create an erection, and they can be deflated after intercourse. A semi-rigid implant. This consists of two silicone rubber rods. The rods provide some rigidity. They are also flexible, so the penis can both curve downward in its normal position and become straight for sexual intercourse. Blood vessel surgery to improve blood flow to the penis. During this procedure, a blood vessel from a different part of the body is placed into the penis to allow blood to flow around (bypass) damaged or blocked blood vessels. Lifestyle changes,  such as exercising more, losing weight, and quitting smoking. Follow  these instructions at home: Medicines  Take over-the-counter and prescription medicines only as told by your health care provider. Do not increase the dosage without first discussing it with your health care provider. If you are using self-injections, do injections as directed by your health care provider. Make sure you avoid any veins that are on the surface of the penis. After giving an injection, apply pressure to the injection site for 5 minutes. Talk to your health care provider about how to prevent headaches while taking ED medicines. These medicines may cause a sudden headache due to the increase in blood flow in your body. General instructions Exercise regularly, as directed by your health care provider. Work with your health care provider to lose weight, if needed. Do not use any products that contain nicotine or tobacco. These products include cigarettes, chewing tobacco, and vaping devices, such as e-cigarettes. If you need help quitting, ask your health care provider. Before using a vacuum pump, read the instructions that come with the pump and discuss any questions with your health care provider. Keep all follow-up visits. This is important. Contact a health care provider if: You feel nauseous. You are vomiting. You get sudden headaches while taking ED medicines. You have any concerns about your sexual health. Get help right away if: You are taking oral or injectable medicines and you have an erection that lasts longer than 4 hours. If your health care provider is unavailable, go to the nearest emergency room for evaluation. An erection that lasts much longer than 4 hours can result in permanent damage to your penis. You have severe pain in your groin or abdomen. You develop redness or severe swelling of your penis. You have redness spreading at your groin or lower abdomen. You are unable to urinate. You experience chest pain or a rapid heartbeat (palpitations) after taking oral  medicines. These symptoms may represent a serious problem that is an emergency. Do not wait to see if the symptoms will go away. Get medical help right away. Call your local emergency services (911 in the [place] Do not drive yourself to the hospital. Summary Erectile dysfunction (ED) is the inability to get or keep an erection during sexual intercourse. This condition is diagnosed based on a physical exam, your symptoms, and tests to determine the cause. Treatment varies depending on the cause and may include medicines, hormone therapy, surgery, or a vacuum pump. You may need follow-up visits to make sure that you are using your medicines or devices correctly. Get help right away if you are taking or injecting medicines and you have an erection that lasts longer than 4 hours. This information is not intended to replace advice given to you by your health care provider. Make sure you discuss any questions you have with your health care provider. Document Revised: 07/01/2020 Document Reviewed: 07/01/2020 Elsevier Patient Education  [employer].  Tadalafil Tablets (Erectile Dysfunction, BPH) What is this medication? TADALAFIL (tah DA la fil) treats erectile dysfunction (ED). It works by increasing blood flow to the penis, which helps to maintain an erection. It may also be used to treat symptoms of an enlarged prostate (benign prostatic hyperplasia). This medicine may be used for other purposes; ask your health care provider or pharmacist if you have questions. COMMON BRAND NAME(S): Kathaleen Bury, Cialis What should I tell my care team before I take this medication? They need to know if you have any of these  conditions: Anatomical deformation of the penis, Peyronie's disease, or history of priapism (painful and prolonged erection) Bleeding disorders Eye or vision problems, including a rare inherited eye disease called retinitis pigmentosa Heart disease, angina, a history of heart attack,  irregular heart beats, or other heart problems High or low blood pressure History of blood diseases, like sickle cell anemia or leukemia History of stomach bleeding Kidney disease Liver disease Stroke An unusual or allergic reaction to tadalafil, other medications, foods, dyes, or preservatives Pregnant or trying to get pregnant Breast-feeding How should I use this medication? Take this medication by mouth with a glass of water. Follow the directions on the prescription label. You may take this medication with or without meals. When this medication is used for erection problems, your care team may prescribe it to be taken once daily or as needed. If you are taking the medication as needed, you may be able to have sexual activity 30 minutes after taking it and for up to 36 hours after taking it. Whether you are taking the medication as needed or once daily, you should not take more than one dose per day. If you are taking this medication for symptoms of benign prostatic hyperplasia (BPH) or to treat both BPH and an erection problem, take the dose once daily at about the same time each day. Do not take your medication more often than directed. Talk to your care team about the use of this medication in children. Special care may be needed. Overdosage: If you think you have taken too much of this medicine contact a poison control center or emergency room at once. NOTE: This medicine is only for you. Do not share this medicine with others. What if I miss a dose? If you are taking this medication as needed for erection problems, this does not apply. If you miss a dose while taking this medication once daily for an erection problem, benign prostatic hyperplasia, or both, take it as soon as you remember, but do not take more than one dose per day. What may interact with this medication? Do not take this medication with any of the following: Nitrates like amyl nitrite, isosorbide dinitrate, isosorbide  mononitrate, nitroglycerin Other medications for erectile dysfunction like avanafil, sildenafil, vardenafil Other tadalafil products (Adcirca) Riociguat This medication may also interact with the following: Certain medications for high blood pressure Certain medications for the treatment of HIV infection or AIDS Certain medications used for fungal or yeast infections, like fluconazole, itraconazole, ketoconazole, and voriconazole Certain medications used for seizures like carbamazepine, phenytoin, and phenobarbital Grapefruit juice Macrolide antibiotics like clarithromycin, erythromycin, troleandomycin Medications for prostate problems Rifabutin, rifampin or rifapentine This list may not describe all possible interactions. Give your health care provider a list of all the medicines, herbs, non-prescription drugs, or dietary supplements you use. Also tell them if you smoke, drink alcohol, or use illegal drugs. Some items may interact with your medicine. What should I watch for while using this medication? If you notice any changes in your vision while taking this medication, call your care team as soon as possible. Stop using this medication and call your care team right away if you have a loss of sight in one or both eyes. Contact your care team right away if the erection lasts longer than 4 hours or if it becomes painful. This may be a sign of serious problem and must be treated right away to prevent permanent damage. If you experience symptoms of nausea, dizziness, chest pain or  arm pain upon initiation of sexual activity after taking this medication, you should refrain from further activity and call your care team as soon as possible. Do not drink alcohol to excess (examples, 5 glasses of wine or 5 shots of whiskey) when taking this medication. When taken in excess, alcohol can increase your chances of getting a headache or getting dizzy, increasing your heart rate or lowering your blood  pressure. Using this medication does not protect you or your partner against HIV infection (the virus that causes AIDS) or other sexually transmitted diseases. What side effects may I notice from receiving this medication? Side effects that you should report to your care team as soon as possible: Allergic reactions-skin rash, itching, hives, swelling of the face, lips, tongue, or throat Hearing loss or ringing in ears Heart attack-pain or tightness in the chest, shoulders, arms, or jaw, nausea, shortness of breath, cold or clammy skin, feeling faint or lightheaded Low blood pressure-dizziness, feeling faint or lightheaded, blurry vision Prolonged or painful erection Redness, blistering, peeling, or loosening of the skin, including inside the mouth Stroke-sudden numbness or weakness of the face, arm, or leg, trouble speaking, confusion, trouble walking, loss of balance or coordination, dizziness, severe headache, change in vision Sudden vision loss in one or both eyes Side effects that usually do not require medical attention (report to your care team if they continue or are bothersome): Back pain Facial flushing or redness Headache Muscle pain Runny or stuffy nose Upset stomach This list may not describe all possible side effects. Call your doctor for medical advice about side effects. You may report side effects to FDA at 1-800-FDA-1088. Where should I keep my medication? Keep out of the reach of children. Store at room temperature between 15 and 30 degrees C (59 and 86 degrees F). Throw away any unused medication after the expiration date. NOTE: This sheet is a summary. It may not cover all possible information. If you have questions about this medicine, talk to your doctor, pharmacist, or health care provider.  2022 Elsevier/Gold Standard (2020-05-25 14:49:19)

## 2020-12-23 NOTE — Progress Notes (Signed)
   12/23/20 2:54 PM   [name] 01-28-1956 [id number]  CC: ED, PSA screening  HPI: I saw Mr. Rosell and his wife today for discussion of ED.  He has had long-term ED for at least a few years it sounds like with difficulty maintaining erection.  Testosterone was recently checked by PCP and was normal at 432.  He was trialed on Viagra 100 mg and Cialis 20 mg both on demand with no significant improvement in his erections.  He also previously was on hydrochlorothiazide, but this was stopped as may have been contributing to the ED.  PSA was also recently checked by PCP and was normal at 2.86.   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 His smoking use included cigars. He has never used smokeless tobacco. He reports current alcohol use of about 3.0 standard drinks per week. He reports that he does not use drugs.  Physical Exam: BP (!) 160/82   Pulse 65   Ht 6' 1.5" (1.867 m)   Wt 217 lb (98.4 kg)   BMI 28.24 kg/m    Constitutional:  Alert and oriented, No acute distress. Cardiovascular: No clubbing, cyanosis, or edema. Respiratory: Normal respiratory effort, no increased work of breathing. GI: Abdomen is soft, nontender, nondistended, no abdominal masses   Assessment & Plan:   65 year old male with long-term ED not responsive to as needed PDE 5 inhibitors,  and normal testosterone.  PSA is normal.  We discussed treatment options for ED including PDE 5 inhibitors both as needed or daily, vacuum erection device, penile injections, or penile prosthesis.  He and his wife are not interested in the vacuum erection device or penile prosthesis, but sounds like they would potentially be open to penile injections if he fails a trial of daily Cialis.  Risk and benefits discussed at length, and I recommended the 20 mg Cialis daily.  Trial of Cialis 20 mg daily, good Rx coupon provided RTC 1 month symptom check, consider penile injections at that time if persistent ED   Nickolas Madrid, MD 12/23/2020  [facility] [address] [place], [place] [address] [phone]

## 2020-12-25 ENCOUNTER — Telehealth: Payer: Self-pay | Admitting: Family Medicine

## 2020-12-25 DIAGNOSIS — L309 Dermatitis, unspecified: Secondary | ICD-10-CM | POA: Diagnosis not present

## 2020-12-25 DIAGNOSIS — I10 Essential (primary) hypertension: Secondary | ICD-10-CM | POA: Diagnosis not present

## 2020-12-25 DIAGNOSIS — Z207 Contact with and (suspected) exposure to pediculosis, acariasis and other infestations: Secondary | ICD-10-CM | POA: Diagnosis not present

## 2020-12-25 DIAGNOSIS — Z87898 Personal history of other specified conditions: Secondary | ICD-10-CM | POA: Diagnosis not present

## 2020-12-25 NOTE — Telephone Encounter (Signed)
Patient came to office to inform provider he was referred by urgent care to [facility] on [address] to follow up on recent poison ivey. Patient also broke out in a rash on Sunday, 9/4, and received another steroid injection today (2nd within 1 week). Patient borderline diabetes and wants follow up with provider. Appointment scheduled for 9/13.

## 2020-12-26 DIAGNOSIS — L237 Allergic contact dermatitis due to plants, except food: Secondary | ICD-10-CM | POA: Diagnosis not present

## 2020-12-26 DIAGNOSIS — L235 Allergic contact dermatitis due to other chemical products: Secondary | ICD-10-CM | POA: Diagnosis not present

## 2020-12-26 DIAGNOSIS — Z91030 Bee allergy status: Secondary | ICD-10-CM | POA: Diagnosis not present

## 2020-12-29 ENCOUNTER — Encounter: Payer: Self-pay | Admitting: Family Medicine

## 2020-12-29 ENCOUNTER — Other Ambulatory Visit: Payer: Self-pay

## 2020-12-29 ENCOUNTER — Ambulatory Visit (INDEPENDENT_AMBULATORY_CARE_PROVIDER_SITE_OTHER): Payer: Medicare HMO | Admitting: Family Medicine

## 2020-12-29 VITALS — BP 120/72 | HR 61 | Ht 73.5 in | Wt 213.0 lb

## 2020-12-29 DIAGNOSIS — T7840XD Allergy, unspecified, subsequent encounter: Secondary | ICD-10-CM

## 2020-12-29 MED ORDER — CEPHALEXIN 500 MG PO CAPS: 500.0000 mg | ORAL_CAPSULE | Freq: Three times a day (TID) | ORAL | 0 refills | Status: DC

## 2020-12-29 NOTE — Progress Notes (Signed)
Subjective:    Patient ID: Frederick Hicks, male    DOB: 1956/01/31, 65 y.o.   MRN: [id number]  HPI 11/20/20 Patient is now engaged to be married.  He is very happy.  He is here today with his fiance.  She seems like a very nice lady and is a retired Marine scientist.  However, he has been dealing with severe fatigue.  She eats very healthy.  She is also got him to start eating healthy and cooking healthy.  He is also given up alcohol.  As result he is lost some weight.  He is also doing a lot of work outside in the heat and he believes that he is sweating a lot.  Therefore I am concerned about dehydration and hypotension.  Patient reports erectile problems.  Viagra 100 mg gave him no benefit.  He also complains of fatigue lack of energy and decreased libido.  He denies any fevers or chills or chest pain.  He denies any bruising.  He denies any melena or hematochezia.  He denies any abdominal pain nausea or vomiting.  At that time, my plan was: I am concerned that the patient may be hypotensive due to dehydration, weight loss, and overmedication.  Therefore of asked him to discontinue hydrochlorothiazide.  This may also be contributing some to his erectile dysfunction.  I will ask his fiance to check his blood pressure numerous times daily and record the values and report them to me in 1 to 2 weeks to determine if we need to replace hydrochlorothiazide with something different such as losartan.  I will also work-up his fatigue by checking a CBC to rule out anemia, checking a CMP, TSH, a testosterone level along with an A1c.  While checking lab work I will screen for prostate cancer with a PSA.  We will switch patient from Viagra to Cialis to see if this works better for him.  If not we may need to consult urology especially if his testosterone level is normal and discontinuing hydrochlorothiazide does not help.   12/29/20 Patient is a very pleasant 65 year old Caucasian gentleman.  He was recently doing some yard work  and was cleaning poison ivy away from the fence.  Shortly thereafter he broke out in a severe blistering rash on the volar surfaces of both forearms from his wrist to his elbows.  He went to an urgent care where he was given a shot of steroids and was put on a prednisone taper pack.  Since he received this, the rash subsided and healed and went away.  However after completing a 6-day course of prednisone, he developed hives all over his body.  He went back to the urgent care and was given another shot of steroids which caused the hives to go away and then was sent to a dermatologist on Saturday.  The dermatologist started him on a Medrol Dosepak.  He is here today for follow-up.  He is doing well with no residual hives.  There is no swelling in his lips or in his throat or in his tongue.  He denies any wheezing or shortness of breath.  There is no residual rash  Past Surgical History:  Procedure Laterality Date   ANKLE FRACTURE SURGERY     left, pinning   COLONOSCOPY  04/01/2015   POLYPECTOMY     TONSILLECTOMY     Current Outpatient Medications on File Prior to Visit  Medication Sig Dispense Refill   aspirin 81 MG tablet Take 81 mg by  mouth daily.     atorvastatin (LIPITOR) 10 MG tablet TAKE 1 TABLET BY MOUTH EVERY DAY 90 tablet 1   hydrochlorothiazide (HYDRODIURIL) 25 MG tablet TAKE 1 TABLET BY MOUTH EVERY DAY 90 tablet 1   losartan (COZAAR) 100 MG tablet Take 1 tablet (100 mg total) by mouth daily. 90 tablet 3   sildenafil (VIAGRA) 100 MG tablet Take 0.5-1 tablets (50-100 mg total) by mouth daily as needed for erectile dysfunction (Take (1) hour prior to activity). 30 tablet 11   tadalafil (CIALIS) 20 MG tablet Take 0.5-1 tablets (10-20 mg total) by mouth every other day as needed for erectile dysfunction. 10 tablet 11   tadalafil (CIALIS) 20 MG tablet Take 1 tablet (20 mg total) by mouth daily. 30 tablet 11   No current facility-administered medications on file prior to visit.   Allergies   Allergen Reactions   Lisinopril Cough   Simvastatin     Mylagias   Social History   Socioeconomic History   Marital status: Widowed    Spouse name: Not on file   Number of children: Not on file   Years of education: Not on file   Highest education level: Not on file  Occupational History   Not on file  Tobacco Use   Smoking status: Former    Types: Cigars    Quit date: 04/18/1997    Years since quitting: 23.7   Smokeless tobacco: Never  Vaping Use   Vaping Use: Never used  Substance and Sexual Activity   Alcohol use: Yes    Alcohol/week: 3.0 standard drinks    Types: 2 Cans of beer, 1 Glasses of wine per week   Drug use: No   Sexual activity: Not on file  Other Topics Concern   Not on file  Social History Narrative   Wife passed away--secondary to acute MI   Pt works--binding books   Has son and grandchildren nearby   Social Determinants of Health   Financial Resource Strain: Low Risk    Difficulty of Paying Living Expenses: Not hard at all  Food Insecurity: No Food Insecurity   Worried About Charity fundraiser in the Last Year: Never true   Arboriculturist in the Last Year: Never true  Transportation Needs: No Transportation Needs   Lack of Transportation (Medical): No   Lack of Transportation (Non-Medical): No  Physical Activity: Sufficiently Active   Days of Exercise per Week: 5 days   Minutes of Exercise per Session: 30 min  Stress: No Stress Concern Present   Feeling of Stress : Not at all  Social Connections: Moderately Integrated   Frequency of Communication with Friends and Family: Three times a week   Frequency of Social Gatherings with Friends and Family: More than three times a week   Attends Religious Services: More than 4 times per year   Active Member of Clubs or Organizations: Yes   Attends Archivist Meetings: More than 4 times per year   Marital Status: Widowed  Human resources officer Violence: Not At Risk   Fear of Current or  Ex-Partner: No   Emotionally Abused: No   Physically Abused: No   Sexually Abused: No   Family History  Problem Relation Age of Onset   Cancer Father 58       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  Constitutional:      Appearance: Normal appearance. He is normal weight.  Neck:     Vascular: No carotid bruit.  Cardiovascular:     Rate and Rhythm: Normal rate and regular rhythm.     Heart sounds: Normal heart sounds.  Pulmonary:     Effort: Pulmonary effort is normal.     Breath sounds: Normal breath sounds.  Musculoskeletal:     Cervical back: Neck supple.     Right lower leg: No edema.     Left lower leg: No edema.  Lymphadenopathy:     Cervical: No cervical adenopathy.  Neurological:     Mental Status: He is alert.          Assessment & Plan:  Allergic reaction, subsequent encounter - Plan: CANCELED: CBC with Differential/Platelet, CANCELED: COMPLETE METABOLIC PANEL WITH GFR Patient had a typical contact dermatitis to poison ivy however it appears that he had a systemic allergic reaction that followed that.  This has been controlled with Medrol Dosepak.  As he weans off the Medrol Dosepak I recommended that he start taking an antihistamine to avoid a rebound urticaria.  Once he is cleared the Medrol Dosepak by several days with no recurrent rash, he can discontinue the antihistamine.  The dermatologist gave him an EpiPen in case he has an allergic reaction to poison ivy in the future.

## 2020-12-30 ENCOUNTER — Telehealth: Payer: Self-pay

## 2020-12-30 NOTE — Telephone Encounter (Signed)
Call placed to patient.   Reports that he is currently on Medrol Dose Pack from dermatology. States that he is having difficulty sleeping at night with bedtime doses.   Advised to use Benadryl to help with jitteriness.   Please advise.

## 2020-12-30 NOTE — Telephone Encounter (Signed)
Pt called in stating that he thinks he is having a reaction to the meds that he was just prescribed (Prednisone). Pt states that he was not able to sleep, and thinks this may be due to that med. Pt asks if he could get something to help him sleep and still continue to take the med as well. Please advise.  Cb#: [phone]

## 2020-12-31 ENCOUNTER — Other Ambulatory Visit: Payer: Self-pay | Admitting: Family Medicine

## 2020-12-31 MED ORDER — ALPRAZOLAM 0.5 MG PO TABS: 0.5000 mg | ORAL_TABLET | Freq: Every evening | ORAL | 0 refills | Status: DC | PRN

## 2020-12-31 NOTE — Telephone Encounter (Signed)
Call placed to patient and patient made aware.  

## 2021-01-08 ENCOUNTER — Encounter: Payer: Self-pay | Admitting: Family Medicine

## 2021-01-08 ENCOUNTER — Other Ambulatory Visit: Payer: Self-pay

## 2021-01-08 ENCOUNTER — Ambulatory Visit (INDEPENDENT_AMBULATORY_CARE_PROVIDER_SITE_OTHER): Payer: Medicare HMO | Admitting: Family Medicine

## 2021-01-08 VITALS — BP 142/78 | HR 74 | Temp 98.4°F | Resp 16 | Ht 73.5 in | Wt 212.0 lb

## 2021-01-08 DIAGNOSIS — K4090 Unilateral inguinal hernia, without obstruction or gangrene, not specified as recurrent: Secondary | ICD-10-CM | POA: Diagnosis not present

## 2021-01-08 MED ORDER — TADALAFIL 20 MG PO TABS: 10.0000 mg | ORAL_TABLET | ORAL | 11 refills | Status: DC | PRN

## 2021-01-08 MED ORDER — ALPRAZOLAM 0.5 MG PO TABS: 0.5000 mg | ORAL_TABLET | Freq: Every evening | ORAL | 3 refills | Status: DC | PRN

## 2021-01-08 NOTE — Progress Notes (Signed)
Subjective:    Patient ID: Frederick Hicks, male    DOB: 1955/06/08, 65 y.o.   MRN: [id number]  HPI Patient has been doing more yard work around his yard and after lifting heavy weight he developed pain in his right inguinal canal.  He states that he is having soreness and tenderness in his right inguinal canal and if he stands for a long period of time he will see a bulge in the right inguinal canal that will go away when he lies down.  Story sounds convincing for an inguinal hernia.  On exam today there is no lymphadenopathy in the right inguinal canal.  There is no palpable bulge.  On Valsalva I am not able to appreciate any hernia or bulge however his history is quite concerning for an inguinal hernia Past Medical History:  Diagnosis Date   Hiatal hernia    Hyperlipidemia    Hypertension    Past Surgical History:  Procedure Laterality Date   ANKLE FRACTURE SURGERY     left, pinning   COLONOSCOPY  04/01/2015   POLYPECTOMY     TONSILLECTOMY     Current Outpatient Medications on File Prior to Visit  Medication Sig Dispense Refill   ALPRAZolam (XANAX) 0.5 MG tablet Take 1 tablet (0.5 mg total) by mouth at bedtime as needed for sleep. 30 tablet 0   aspirin 81 MG tablet Take 81 mg by mouth daily.     atorvastatin (LIPITOR) 10 MG tablet TAKE 1 TABLET BY MOUTH EVERY DAY 90 tablet 1   hydrochlorothiazide (HYDRODIURIL) 25 MG tablet TAKE 1 TABLET BY MOUTH EVERY DAY 90 tablet 1   losartan (COZAAR) 100 MG tablet Take 1 tablet (100 mg total) by mouth daily. 90 tablet 3   sildenafil (VIAGRA) 100 MG tablet Take 0.5-1 tablets (50-100 mg total) by mouth daily as needed for erectile dysfunction (Take (1) hour prior to activity). 30 tablet 11   tadalafil (CIALIS) 20 MG tablet Take 0.5-1 tablets (10-20 mg total) by mouth every other day as needed for erectile dysfunction. 10 tablet 11   tadalafil (CIALIS) 20 MG tablet Take 1 tablet (20 mg total) by mouth daily. 30 tablet 11   No current  facility-administered medications on file prior to visit.   Allergies  Allergen Reactions   Lisinopril Cough   Poison Ivy Extract     Had systemic urticarial reaction to poison ivy   Simvastatin     Mylagias   Social History   Socioeconomic History   Marital status: Widowed    Spouse name: Not on file   Number of children: Not on file   Years of education: Not on file   Highest education level: Not on file  Occupational History   Not on file  Tobacco Use   Smoking status: Former    Types: Cigars    Quit date: 04/18/1997    Years since quitting: 23.7   Smokeless tobacco: Never  Vaping Use   Vaping Use: Never used  Substance and Sexual Activity   Alcohol use: Yes    Alcohol/week: 3.0 standard drinks    Types: 2 Cans of beer, 1 Glasses of wine per week   Drug use: No   Sexual activity: Not on file  Other Topics Concern   Not on file  Social History Narrative   Wife passed away--secondary to acute MI   Pt works--binding books   Has son and grandchildren nearby   Social Determinants of Health   Financial Resource Strain:  Low Risk    Difficulty of Paying Living Expenses: Not hard at all  Food Insecurity: No Food Insecurity   Worried About Running Out of Food in the Last Year: Never true   Ran Out of Food in the Last Year: Never true  Transportation Needs: No Transportation Needs   Lack of Transportation (Medical): No   Lack of Transportation (Non-Medical): No  Physical Activity: Sufficiently Active   Days of Exercise per Week: 5 days   Minutes of Exercise per Session: 30 min  Stress: No Stress Concern Present   Feeling of Stress : Not at all  Social Connections: Moderately Integrated   Frequency of Communication with Friends and Family: Three times a week   Frequency of Social Gatherings with Friends and Family: More than three times a week   Attends Religious Services: More than 4 times per year   Active Member of Clubs or Organizations: Yes   Attends Theatre manager Meetings: More than 4 times per year   Marital Status: Widowed  Human resources officer Violence: Not At Risk   Fear of Current or Ex-Partner: No   Emotionally Abused: No   Physically Abused: No   Sexually Abused: No   Family History  Problem Relation Age of Onset   Cancer Father 16       lung 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  Constitutional:      Appearance: Normal appearance. He is normal weight.  Neck:     Vascular: No carotid bruit.  Cardiovascular:     Rate and Rhythm: Normal rate and regular rhythm.     Heart sounds: Normal heart sounds.  Pulmonary:     Effort: Pulmonary effort is normal.     Breath sounds: Normal breath sounds.  Abdominal:     Hernia: There is no hernia in the right inguinal area.  Genitourinary:    Penis: Normal.      Testes: Normal.     Epididymis:     Right: Normal.     Left: Normal.  Musculoskeletal:     Cervical back: Neck supple.     Right lower leg: No edema.     Left lower leg: No edema.  Lymphadenopathy:     Cervical: No cervical adenopathy.     Lower Body: No right inguinal adenopathy.  Neurological:     Mental Status: He is alert.          Assessment & Plan:  Right inguinal hernia Despite my best efforts I am unable to appreciate a hernia today on his exam however his history is convincing for hernia.  Therefore I will consult to general surgeon for correction.  If the surgeon is not able to appreciate 1 either, we can always obtain imaging such as an ultrasound to confirm its presence prior to any surgical correction.  I did refill his Xanax which he states he is using sparingly for insomnia

## 2021-01-12 ENCOUNTER — Telehealth: Payer: Self-pay | Admitting: Family Medicine

## 2021-01-12 NOTE — Telephone Encounter (Signed)
Patient called to request referral for surgeon in the [facility]; current referral for provider in [place]. Please advise at [phone]

## 2021-01-12 NOTE — Telephone Encounter (Signed)
Please change referral to [facility]

## 2021-01-15 ENCOUNTER — Telehealth: Payer: Self-pay

## 2021-01-15 NOTE — Telephone Encounter (Signed)
Pt came by office with quesitons regarding Medrol Dose pack 10 mg. Pt states he has finished one box of 10 mg and picked up a 2nd box from the pharmacy today. Pt states he is unsure how to take it and I did not see directions in his chart. Pt states the poison oak was getting better when he finished 1st box but is now coming back again. Thanks.

## 2021-01-20 NOTE — Progress Notes (Signed)
01/21/2021 3:32 PM   Frederick Hicks [birth year] [id number]  Referring provider: Susy Frizzle, MD [address] [place],  [place] [address]  Chief Complaint  Patient presents with   Erectile Dysfunction   Urological history: 1. ED -contributing factors of age, HTN, HLD, BPH and history of smoking -SHIM 13  2. BPH with LU TS -PSA 2.86 11/2020    HPI: Frederick Hicks is a 65 y.o. male who presents today for one month follow up after a trial of tadalafil 20 mg daily for ED.  He has noticed significant improvement in his erections after taking tadalafil 20 mg daily for his erectile dysfunction.  He Hicks his fiance who is a nurse is concerned on possible side effects with taking the medication daily.  Patient still having spontaneous erections.  He denies any pain or curvature with erections.       SHIM     Row Name 01/21/21 1459         SHIM: Over the last 6 months:   How do you rate your confidence that you could get and keep an erection? Moderate     When you had erections with sexual stimulation, how often were your erections hard enough for penetration (entering your partner)? Sometimes (about half the time)     During sexual intercourse, how often were you able to maintain your erection after you had penetrated (entered) your partner? A Few Times (much less than half the time)     During sexual intercourse, how difficult was it to maintain your erection to completion of intercourse? Very Difficult     When you attempted sexual intercourse, how often was it satisfactory for you? Sometimes (about half the time)           SHIM Total Score   SHIM 13              Score: 1-7 Severe ED 8-11 Moderate ED 12-16 Mild-Moderate ED 17-21 Mild ED 22-25 No ED      PMH: Past Medical History:  Diagnosis Date   Hiatal hernia    Hyperlipidemia    Hypertension     Surgical History: Past Surgical History:  Procedure Laterality Date   ANKLE FRACTURE  SURGERY     left, pinning   COLONOSCOPY  04/01/2015   POLYPECTOMY     TONSILLECTOMY      Home Medications:  Allergies as of 01/21/2021       Reactions   Lisinopril Cough   Poison Ivy Extract    Had systemic urticarial reaction to poison ivy   Simvastatin    Mylagias        Medication List        Accurate as of January 21, 2021  3:32 PM. If you have any questions, ask your nurse or doctor.          ALPRAZolam 0.5 MG tablet Commonly known as: Xanax Take 1 tablet (0.5 mg total) by mouth at bedtime as needed for sleep.   aspirin 81 MG tablet Take 81 mg by mouth daily.   atorvastatin 10 MG tablet Commonly known as: LIPITOR TAKE 1 TABLET BY MOUTH EVERY DAY   predniSONE 10 MG (48) Tbpk tablet Commonly known as: STERAPRED UNI-PAK 48 TAB Take by mouth as directed.   tadalafil 20 MG tablet Commonly known as: CIALIS Take 0.5-1 tablets (10-20 mg total) by mouth every other day as needed for erectile dysfunction.        Allergies:  Allergies  Allergen Reactions   Lisinopril Cough   Poison Ivy Extract     Had systemic urticarial reaction to poison ivy   Simvastatin     Mylagias    Family History: Family History  Problem Relation Age of Onset   Cancer Father 46       lung 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   Gout Maternal Uncle    Gout Maternal Grandfather    Colon cancer Neg Hx    Esophageal cancer Neg Hx    Rectal cancer Neg Hx    Stomach cancer Neg Hx    Colon polyps Neg Hx     Social History:  reports that he quit smoking about 23 years ago. His smoking use included cigars. He has never used smokeless tobacco. He reports current alcohol use of about 3.0 standard drinks per week. He reports that he does not use drugs.  ROS: Pertinent ROS in HPI  Physical Exam: BP (!) 163/83   Pulse 67   Ht 6' 1.5" (1.867 m)   Wt 212 lb (96.2 kg)   BMI 27.59 kg/m   Constitutional:  Well nourished. Alert and oriented, No acute distress. HEENT: [place]  AT, mask in place.  Trachea midline Cardiovascular: No clubbing, cyanosis, or edema. Respiratory: Normal respiratory effort, no increased work of breathing. Neurologic: Grossly intact, no focal deficits, moving all 4 extremities. Psychiatric: Normal mood and affect.  Laboratory Data: Lab Results  Component Value Date   WBC 7.2 11/20/2020   HGB 14.2 11/20/2020   HCT 43.8 11/20/2020   MCV 92.8 11/20/2020   PLT 271 11/20/2020    Lab Results  Component Value Date   CREATININE 1.06 11/20/2020    Lab Results  Component Value Date   PSA 2.86 11/20/2020   PSA 1.82 05/14/2020   PSA 1.7 11/20/2018    Lab Results  Component Value Date   TESTOSTERONE 432 11/20/2020    Lab Results  Component Value Date   HGBA1C 5.7 (H) 11/20/2020    Lab Results  Component Value Date   TSH 1.58 11/20/2020       Component Value Date/Time   CHOL 144 05/14/2020 1104   HDL 33 (L) 05/14/2020 1104   CHOLHDL 4.4 05/14/2020 1104   VLDL 41 (H) 02/11/2015 1551   LDLCALC 86 05/14/2020 1104    Lab Results  Component Value Date   AST 19 11/20/2020   Lab Results  Component Value Date   ALT 20 11/20/2020   I have reviewed the labs.   Pertinent Imaging: N/A  Assessment & Plan:    1. ED -has noticed great improvement with tadalafil 20 mg daily -reviewed side effects and safety profile of taking the tadalafil daily -continue tadalafil 20 mg daily   Return in about 1 year (around 01/21/2022) for SHIM .  These notes generated with voice recognition software. I apologize for typographical errors.  Zara Council, PA-C  [facility] [address]  [place], [place] [address] [phone]

## 2021-01-21 ENCOUNTER — Other Ambulatory Visit: Payer: Self-pay

## 2021-01-21 ENCOUNTER — Ambulatory Visit (INDEPENDENT_AMBULATORY_CARE_PROVIDER_SITE_OTHER): Payer: Medicare HMO | Admitting: Urology

## 2021-01-21 ENCOUNTER — Encounter: Payer: Self-pay | Admitting: Urology

## 2021-01-21 VITALS — BP 163/83 | HR 67 | Ht 73.5 in | Wt 212.0 lb

## 2021-01-21 DIAGNOSIS — N529 Male erectile dysfunction, unspecified: Secondary | ICD-10-CM | POA: Diagnosis not present

## 2021-01-22 ENCOUNTER — Ambulatory Visit: Payer: Medicare HMO | Admitting: Surgery

## 2021-01-28 ENCOUNTER — Telehealth: Payer: Self-pay | Admitting: *Deleted

## 2021-01-28 NOTE — Telephone Encounter (Signed)
Received call from patient.   Requested information on status of referral to surgeon.   Please contact him to discuss.

## 2021-03-09 DIAGNOSIS — K4090 Unilateral inguinal hernia, without obstruction or gangrene, not specified as recurrent: Secondary | ICD-10-CM | POA: Diagnosis not present

## 2021-04-14 NOTE — Pre-Procedure Instructions (Addendum)
Surgical Instructions    Your procedure is scheduled on Monday, January 9th.  Report to [facility] Main Entrance "A" at 07:30 A.M., then check in with the Admitting office.  Call this number if you have problems the morning of surgery:  [phone]   If you have any questions prior to your surgery date call [phone]: Open Monday-Friday 8am-4pm    Remember:  Do not eat or drink after midnight the night before your surgery     Take these medicines the morning of surgery with A SIP OF WATER  atorvastatin (LIPITOR)   If needed: cetirizine (ZYRTEC)  As of today, STOP taking any Aspirin (unless otherwise instructed by your surgeon) Aleve, Naproxen, Ibuprofen, Motrin, Advil, Goody's, BC's, all herbal medications, fish oil, and all vitamins.                     Do NOT Smoke (Tobacco/Vaping) or drink Alcohol 24 hours prior to your procedure.  If you use a CPAP at night, you may bring all equipment for your overnight stay.   Contacts, glasses, piercing's, hearing aid's, dentures or partials may not be worn into surgery, please bring cases for these belongings.    For patients admitted to the hospital, discharge time will be determined by your treatment team.   Patients discharged the day of surgery will not be allowed to drive home, and someone needs to stay with them for 24 hours.  NO VISITORS WILL BE ALLOWED IN PRE-OP WHERE PATIENTS GET READY FOR SURGERY.  ONLY 1 SUPPORT PERSON MAY BE PRESENT IN THE WAITING ROOM WHILE YOU ARE IN SURGERY.  IF YOU ARE TO BE ADMITTED, ONCE YOU ARE IN YOUR ROOM YOU WILL BE ALLOWED TWO (2) VISITORS.  Minor children may have two parents present. Special consideration for safety and communication needs will be reviewed on a case by case basis.   Special instructions:   [facility]- Preparing For Surgery  Before surgery, you can play an important role. Because skin is not sterile, your skin needs to be as free of germs as possible. You can reduce the  number of germs on your skin by washing with CHG (chlorahexidine gluconate) Soap before surgery.  CHG is an antiseptic cleaner which kills germs and bonds with the skin to continue killing germs even after washing.    Oral Hygiene is also important to reduce your risk of infection.  Remember - BRUSH YOUR TEETH THE MORNING OF SURGERY WITH YOUR REGULAR TOOTHPASTE  Please do not use if you have an allergy to CHG or antibacterial soaps. If your skin becomes reddened/irritated stop using the CHG.  Do not shave (including legs and underarms) for at least 48 hours prior to first CHG shower. It is OK to shave your face.  Please follow these instructions carefully.   Shower the NIGHT BEFORE SURGERY and the MORNING OF SURGERY  If you chose to wash your hair, wash your hair first as usual with your normal shampoo.  After you shampoo, rinse your hair and body thoroughly to remove the shampoo.  Use CHG Soap as you would any other liquid soap. You can apply CHG directly to the skin and wash gently with a scrungie or a clean washcloth.   Apply the CHG Soap to your body ONLY FROM THE NECK DOWN.  Do not use on open wounds or open sores. Avoid contact with your eyes, ears, mouth and genitals (private parts). Wash Face and genitals (private parts)  with your  normal soap.   Wash thoroughly, paying special attention to the area where your surgery will be performed.  Thoroughly rinse your body with warm water from the neck down.  DO NOT shower/wash with your normal soap after using and rinsing off the CHG Soap.  Pat yourself dry with a CLEAN TOWEL.  Wear CLEAN PAJAMAS to bed the night before surgery  Place CLEAN SHEETS on your bed the night before your surgery  DO NOT SLEEP WITH PETS.   Day of Surgery: Shower with CHG soap. Do not wear jewelry Do not wear lotions, powders, colognes, or deodorant. Men may shave face and neck. Do not bring valuables to the hospital. [facility] is not responsible for  any belongings or valuables. Wear Clean/Comfortable clothing the morning of surgery Remember to brush your teeth WITH YOUR REGULAR TOOTHPASTE.   Please read over the following fact sheets that you were given.   3 days prior to your procedure or After your COVID test   You are not required to quarantine however you are required to wear a well-fitting mask when you are out and around people not in your household. If your mask becomes wet or soiled, replace with a new one.   Wash your hands often with soap and water for 20 seconds or clean your hands with an alcohol-based hand sanitizer that contains at least 60% alcohol.   Do not share personal items.   Notify your provider:  o if you are in close contact with someone who has COVID  o or if you develop a fever of 100.4 or greater, sneezing, cough, sore throat, shortness of breath or body aches.

## 2021-04-15 ENCOUNTER — Encounter (HOSPITAL_COMMUNITY): Payer: Self-pay

## 2021-04-15 ENCOUNTER — Ambulatory Visit: Payer: Self-pay | Admitting: Surgery

## 2021-04-15 ENCOUNTER — Other Ambulatory Visit: Payer: Self-pay

## 2021-04-15 ENCOUNTER — Encounter (HOSPITAL_COMMUNITY)
Admission: RE | Admit: 2021-04-15 | Discharge: 2021-04-15 | Disposition: A | Discharge status: Home / Self Care | Payer: Medicare HMO | Source: Ambulatory Visit | Attending: Surgery | Admitting: Surgery

## 2021-04-15 VITALS — BP 150/80 | HR 80 | Temp 97.9°F | Resp 17 | Ht 73.0 in | Wt 211.1 lb

## 2021-04-15 DIAGNOSIS — I10 Essential (primary) hypertension: Secondary | ICD-10-CM | POA: Insufficient documentation

## 2021-04-15 DIAGNOSIS — R7303 Prediabetes: Secondary | ICD-10-CM | POA: Diagnosis not present

## 2021-04-15 DIAGNOSIS — Z01818 Encounter for other preprocedural examination: Secondary | ICD-10-CM | POA: Insufficient documentation

## 2021-04-15 HISTORY — DX: Prediabetes: R73.03

## 2021-04-15 LAB — CBC
HCT: 46.3 % (ref 39.0–52.0)
Hemoglobin: 15.3 g/dL (ref 13.0–17.0)
MCH: 30.2 pg (ref 26.0–34.0)
MCHC: 33 g/dL (ref 30.0–36.0)
MCV: 91.5 fL (ref 80.0–100.0)
Platelets: 241 10*3/uL (ref 150–400)
RBC: 5.06 MIL/uL (ref 4.22–5.81)
RDW: 13.2 % (ref 11.5–15.5)
WBC: 8.3 10*3/uL (ref 4.0–10.5)
nRBC: 0 % (ref 0.0–0.2)

## 2021-04-15 LAB — BASIC METABOLIC PANEL
Anion gap: 7 (ref 5–15)
BUN: 10 mg/dL (ref 8–23)
CO2: 27 mmol/L (ref 22–32)
Calcium: 9.3 mg/dL (ref 8.9–10.3)
Chloride: 104 mmol/L (ref 98–111)
Creatinine, Ser: 1.12 mg/dL (ref 0.61–1.24)
GFR, Estimated: >60 mL/min (ref >60)
Glucose, Bld: 106 mg/dL — ABNORMAL HIGH (ref 70–99)
Potassium: 4 mmol/L (ref 3.5–5.1)
Sodium: 138 mmol/L (ref 135–145)

## 2021-04-15 LAB — GLUCOSE, CAPILLARY: Glucose-Capillary: 112 mg/dL — ABNORMAL HIGH (ref 70–99)

## 2021-04-15 LAB — HEMOGLOBIN A1C
Hgb A1c MFr Bld: 6 % — ABNORMAL HIGH (ref 4.8–5.6)
Mean Plasma Glucose: 125.5 mg/dL

## 2021-04-15 NOTE — Progress Notes (Signed)
PCP - Dr. Jenna Luo Cardiologist - denies  PPM/ICD - denies   Chest x-ray - pt states he had one a few years ago and it was normal, records requested EKG - 04/15/21 at PAT Stress Test - pt states he had one about 9 years ago, no abnormalities ECHO - denies Cardiac Cath - denies  Sleep Study - pt states he had a sleep study many years ago but was not diagnosed with OSA  DM- denies  Blood Thinner Instructions: n/a Aspirin Instructions: pt instructed to stop ASA today  ERAS Protcol - no, NPO   COVID TEST- n/a, ambulatory   Anesthesia review: no  Patient denies shortness of breath, fever, cough and chest pain at PAT appointment   All instructions explained to the patient, with a verbal understanding of the material. Patient agrees to go over the instructions while at home for a better understanding. Patient also instructed to wear a mask in public for 3 days prior to surgery. The opportunity to ask questions was provided.

## 2021-04-25 NOTE — Anesthesia Preprocedure Evaluation (Addendum)
Anesthesia Evaluation  Patient identified by MRN, date of birth, ID band Patient awake    Reviewed: Allergy & Precautions, NPO status , Patient's Chart, lab work & pertinent test results  Airway Mallampati: III  TM Distance: >3 FB Neck ROM: Full    Dental  (+) Teeth Intact, Dental Advisory Given   Pulmonary former smoker,  Snores occasionally, sleep study in remote past negative   Pulmonary exam normal breath sounds clear to auscultation       Cardiovascular hypertension (has been off meds for a few months with weight loss, 145/89 in preop), Normal cardiovascular exam Rhythm:Regular Rate:Normal     Neuro/Psych negative neurological ROS  negative psych ROS   GI/Hepatic Neg liver ROS, hiatal hernia,   Endo/Other  negative endocrine ROS  Renal/GU negative Renal ROS  negative genitourinary   Musculoskeletal negative musculoskeletal ROS (+)   Abdominal   Peds  Hematology negative hematology ROS (+) hct 46.3   Anesthesia Other Findings   Reproductive/Obstetrics negative OB ROS                            Anesthesia Physical Anesthesia Plan  ASA: 2  Anesthesia Plan: General   Post-op Pain Management: Tylenol PO (pre-op)   Induction: Intravenous  PONV Risk Score and Plan: 3 and Ondansetron, Dexamethasone, Midazolam and Treatment may vary due to age or medical condition  Airway Management Planned: Oral ETT  Additional Equipment: None  Intra-op Plan:   Post-operative Plan: Extubation in OR  Informed Consent: I have reviewed the patients History and Physical, chart, labs and discussed the procedure including the risks, benefits and alternatives for the proposed anesthesia with the patient or authorized representative who has indicated his/her understanding and acceptance.     Dental advisory given  Plan Discussed with: CRNA  Anesthesia Plan Comments:        Anesthesia Quick  Evaluation

## 2021-04-26 ENCOUNTER — Ambulatory Visit (HOSPITAL_COMMUNITY)
Admission: RE | Admit: 2021-04-26 | Discharge: 2021-04-26 | Disposition: A | Discharge status: Home / Self Care | Payer: Medicare HMO | Attending: Surgery | Admitting: Surgery

## 2021-04-26 ENCOUNTER — Encounter (HOSPITAL_COMMUNITY): Payer: Self-pay | Admitting: Surgery

## 2021-04-26 ENCOUNTER — Ambulatory Visit (HOSPITAL_COMMUNITY): Payer: Medicare HMO | Admitting: Anesthesiology

## 2021-04-26 ENCOUNTER — Other Ambulatory Visit: Payer: Self-pay

## 2021-04-26 ENCOUNTER — Encounter (HOSPITAL_COMMUNITY)
Admission: RE | Disposition: A | Discharge status: Home / Self Care | Payer: Self-pay | Source: Home / Self Care | Attending: Surgery

## 2021-04-26 DIAGNOSIS — D176 Benign lipomatous neoplasm of spermatic cord: Secondary | ICD-10-CM | POA: Diagnosis not present

## 2021-04-26 DIAGNOSIS — Z87891 Personal history of nicotine dependence: Secondary | ICD-10-CM | POA: Diagnosis not present

## 2021-04-26 DIAGNOSIS — R7303 Prediabetes: Secondary | ICD-10-CM | POA: Diagnosis not present

## 2021-04-26 DIAGNOSIS — K4030 Unilateral inguinal hernia, with obstruction, without gangrene, not specified as recurrent: Secondary | ICD-10-CM | POA: Insufficient documentation

## 2021-04-26 DIAGNOSIS — I10 Essential (primary) hypertension: Secondary | ICD-10-CM | POA: Diagnosis not present

## 2021-04-26 DIAGNOSIS — Y93E8 Activity, other personal hygiene: Secondary | ICD-10-CM | POA: Insufficient documentation

## 2021-04-26 DIAGNOSIS — W268XXA Contact with other sharp object(s), not elsewhere classified, initial encounter: Secondary | ICD-10-CM | POA: Insufficient documentation

## 2021-04-26 DIAGNOSIS — K4090 Unilateral inguinal hernia, without obstruction or gangrene, not specified as recurrent: Secondary | ICD-10-CM | POA: Diagnosis not present

## 2021-04-26 HISTORY — PX: INGUINAL HERNIA REPAIR: SHX194

## 2021-04-26 HISTORY — PX: INSERTION OF MESH: SHX5868

## 2021-04-26 LAB — GLUCOSE, CAPILLARY: Glucose-Capillary: 105 mg/dL — ABNORMAL HIGH (ref 70–99)

## 2021-04-26 SURGERY — REPAIR, HERNIA, INGUINAL, ADULT
Anesthesia: General | Site: Inguinal | Laterality: Right

## 2021-04-26 MED ORDER — AMISULPRIDE (ANTIEMETIC) 5 MG/2ML IV SOLN
10.0000 mg | Freq: Once | INTRAVENOUS | Status: AC
  Administered 2021-04-26: 10 mg via INTRAVENOUS

## 2021-04-26 MED ORDER — SUGAMMADEX SODIUM 200 MG/2ML IV SOLN
INTRAVENOUS | Status: DC | PRN
Start: 2021-04-26 — End: 2021-04-26
  Administered 2021-04-26: 200 mg via INTRAVENOUS

## 2021-04-26 MED ORDER — ORAL CARE MOUTH RINSE: 15.0000 mL | Freq: Once | OROMUCOSAL | Status: AC

## 2021-04-26 MED ORDER — CHLORHEXIDINE GLUCONATE CLOTH 2 % EX PADS: 6.0000 | MEDICATED_PAD | Freq: Once | CUTANEOUS | Status: DC

## 2021-04-26 MED ORDER — PROMETHAZINE HCL 25 MG/ML IJ SOLN: 6.2500 mg | INTRAMUSCULAR | Status: DC | PRN

## 2021-04-26 MED ORDER — LIDOCAINE HCL (CARDIAC) PF 100 MG/5ML IV SOSY
PREFILLED_SYRINGE | INTRAVENOUS | Status: DC | PRN
  Administered 2021-04-26: 60 mg via INTRAVENOUS

## 2021-04-26 MED ORDER — KETOROLAC TROMETHAMINE 30 MG/ML IJ SOLN
INTRAMUSCULAR | Status: AC
  Filled 2021-04-26: qty 2

## 2021-04-26 MED ORDER — DEXAMETHASONE SODIUM PHOSPHATE 10 MG/ML IJ SOLN
INTRAMUSCULAR | Status: AC
  Filled 2021-04-26: qty 1

## 2021-04-26 MED ORDER — HYDROMORPHONE HCL 1 MG/ML IJ SOLN
INTRAMUSCULAR | Status: AC
  Filled 2021-04-26: qty 1

## 2021-04-26 MED ORDER — IBUPROFEN 600 MG PO TABS: 600.0000 mg | ORAL_TABLET | Freq: Four times a day (QID) | ORAL | 1 refills | Status: DC | PRN

## 2021-04-26 MED ORDER — DEXAMETHASONE SODIUM PHOSPHATE 10 MG/ML IJ SOLN
INTRAMUSCULAR | Status: DC | PRN
  Administered 2021-04-26: 5 mg via INTRAVENOUS

## 2021-04-26 MED ORDER — BUPIVACAINE-EPINEPHRINE 0.25% -1:200000 IJ SOLN
INTRAMUSCULAR | Status: DC | PRN
  Administered 2021-04-26: 30 mL

## 2021-04-26 MED ORDER — OXYCODONE HCL 5 MG PO TABS: 5.0000 mg | ORAL_TABLET | ORAL | 0 refills | Status: DC | PRN

## 2021-04-26 MED ORDER — METHOCARBAMOL 750 MG PO TABS: 750.0000 mg | ORAL_TABLET | Freq: Four times a day (QID) | ORAL | 1 refills | Status: DC

## 2021-04-26 MED ORDER — BUPIVACAINE LIPOSOME 1.3 % IJ SUSP: 20.0000 mL | Freq: Once | INTRAMUSCULAR | Status: DC

## 2021-04-26 MED ORDER — KETOROLAC TROMETHAMINE 30 MG/ML IJ SOLN
INTRAMUSCULAR | Status: DC | PRN
  Administered 2021-04-26: 30 mg via INTRAVENOUS

## 2021-04-26 MED ORDER — MIDAZOLAM HCL 2 MG/2ML IJ SOLN
INTRAMUSCULAR | Status: AC
  Filled 2021-04-26: qty 2

## 2021-04-26 MED ORDER — ROCURONIUM 10MG/ML (10ML) SYRINGE FOR MEDFUSION PUMP - OPTIME
INTRAVENOUS | Status: DC | PRN
  Administered 2021-04-26: 100 mg via INTRAVENOUS

## 2021-04-26 MED ORDER — PHENYLEPHRINE HCL-NACL 20-0.9 MG/250ML-% IV SOLN
INTRAVENOUS | Status: DC | PRN
  Administered 2021-04-26: 20 ug/min via INTRAVENOUS

## 2021-04-26 MED ORDER — PROPOFOL 10 MG/ML IV BOLUS
INTRAVENOUS | Status: AC
  Filled 2021-04-26: qty 20

## 2021-04-26 MED ORDER — OXYCODONE HCL 5 MG/5ML PO SOLN: 5.0000 mg | Freq: Once | ORAL | Status: DC | PRN

## 2021-04-26 MED ORDER — LACTATED RINGERS IV SOLN: INTRAVENOUS | Status: DC | PRN

## 2021-04-26 MED ORDER — CHLORHEXIDINE GLUCONATE 0.12 % MT SOLN
15.0000 mL | Freq: Once | OROMUCOSAL | Status: AC
  Administered 2021-04-26: 15 mL via OROMUCOSAL
  Filled 2021-04-26: qty 15

## 2021-04-26 MED ORDER — LACTATED RINGERS IV SOLN: INTRAVENOUS | Status: DC

## 2021-04-26 MED ORDER — PHENYLEPHRINE HCL (PRESSORS) 10 MG/ML IV SOLN
INTRAVENOUS | Status: DC | PRN
  Administered 2021-04-26: 80 ug via INTRAVENOUS

## 2021-04-26 MED ORDER — OXYCODONE HCL 5 MG PO TABS: 5.0000 mg | ORAL_TABLET | Freq: Once | ORAL | Status: DC | PRN

## 2021-04-26 MED ORDER — ACETAMINOPHEN 500 MG PO TABS
1000.0000 mg | ORAL_TABLET | Freq: Once | ORAL | Status: AC
  Administered 2021-04-26: 1000 mg via ORAL
  Filled 2021-04-26: qty 2

## 2021-04-26 MED ORDER — HYDROMORPHONE HCL 1 MG/ML IJ SOLN
0.2500 mg | INTRAMUSCULAR | Status: DC | PRN
  Administered 2021-04-26 (×3): 0.5 mg via INTRAVENOUS

## 2021-04-26 MED ORDER — AMISULPRIDE (ANTIEMETIC) 5 MG/2ML IV SOLN
INTRAVENOUS | Status: AC
  Filled 2021-04-26: qty 2

## 2021-04-26 MED ORDER — ONDANSETRON HCL 4 MG/2ML IJ SOLN
INTRAMUSCULAR | Status: DC | PRN
  Administered 2021-04-26: 4 mg via INTRAVENOUS

## 2021-04-26 MED ORDER — PROPOFOL 10 MG/ML IV BOLUS
INTRAVENOUS | Status: DC | PRN
  Administered 2021-04-26: 150 mg via INTRAVENOUS

## 2021-04-26 MED ORDER — 0.9 % SODIUM CHLORIDE (POUR BTL) OPTIME
TOPICAL | Status: DC | PRN
  Administered 2021-04-26: 1000 mL

## 2021-04-26 MED ORDER — ONDANSETRON HCL 4 MG/2ML IJ SOLN
INTRAMUSCULAR | Status: AC
  Filled 2021-04-26: qty 2

## 2021-04-26 MED ORDER — DOCUSATE SODIUM 100 MG PO CAPS: 100.0000 mg | ORAL_CAPSULE | Freq: Two times a day (BID) | ORAL | 2 refills | Status: DC

## 2021-04-26 MED ORDER — MIDAZOLAM HCL 2 MG/2ML IJ SOLN
INTRAMUSCULAR | Status: DC | PRN
  Administered 2021-04-26: 2 mg via INTRAVENOUS

## 2021-04-26 MED ORDER — CEFAZOLIN SODIUM-DEXTROSE 2-4 GM/100ML-% IV SOLN
2.0000 g | INTRAVENOUS | Status: AC
  Administered 2021-04-26: 2 g via INTRAVENOUS
  Filled 2021-04-26: qty 100

## 2021-04-26 MED ORDER — BUPIVACAINE-EPINEPHRINE (PF) 0.25% -1:200000 IJ SOLN
INTRAMUSCULAR | Status: AC
  Filled 2021-04-26: qty 30

## 2021-04-26 MED ORDER — FENTANYL CITRATE (PF) 250 MCG/5ML IJ SOLN
INTRAMUSCULAR | Status: DC | PRN
Start: 2021-04-26 — End: 2021-04-26
  Administered 2021-04-26: 50 ug via INTRAVENOUS
  Administered 2021-04-26: 100 ug via INTRAVENOUS

## 2021-04-26 MED ORDER — FENTANYL CITRATE (PF) 250 MCG/5ML IJ SOLN
INTRAMUSCULAR | Status: AC
  Filled 2021-04-26: qty 5

## 2021-04-26 SURGICAL SUPPLY — 47 items
BAG COUNTER SPONGE SURGICOUNT (BAG) ×2 IMPLANT
BENZOIN TINCTURE PRP APPL 2/3 (GAUZE/BANDAGES/DRESSINGS) IMPLANT
BLADE CLIPPER SURG (BLADE) IMPLANT
CHLORAPREP W/TINT 26 (MISCELLANEOUS) ×2 IMPLANT
COVER SURGICAL LIGHT HANDLE (MISCELLANEOUS) ×2 IMPLANT
DERMABOND ADVANCED (GAUZE/BANDAGES/DRESSINGS) ×1
DERMABOND ADVANCED .7 DNX12 (GAUZE/BANDAGES/DRESSINGS) ×1 IMPLANT
DRAIN PENROSE 1/2X12 LTX STRL (WOUND CARE) ×1 IMPLANT
DRAPE INCISE IOBAN 66X45 STRL (DRAPES) ×1 IMPLANT
DRAPE LAPAROSCOPIC ABDOMINAL (DRAPES) IMPLANT
DRAPE LAPAROTOMY TRNSV 102X78 (DRAPES) ×1 IMPLANT
DRSG TEGADERM 4X4.75 (GAUZE/BANDAGES/DRESSINGS) IMPLANT
ELECT CAUTERY BLADE 6.4 (BLADE) ×1 IMPLANT
ELECT REM PT RETURN 9FT ADLT (ELECTROSURGICAL) ×2
ELECTRODE REM PT RTRN 9FT ADLT (ELECTROSURGICAL) ×1 IMPLANT
GAUZE 4X4 16PLY ~~LOC~~+RFID DBL (SPONGE) ×2 IMPLANT
GAUZE SPONGE 4X4 12PLY STRL (GAUZE/BANDAGES/DRESSINGS) IMPLANT
GLOVE SURG ENC MOIS LTX SZ6.5 (GLOVE) ×2 IMPLANT
GLOVE SURG UNDER POLY LF SZ6 (GLOVE) ×2 IMPLANT
GOWN STRL REUS W/ TWL LRG LVL3 (GOWN DISPOSABLE) ×3 IMPLANT
GOWN STRL REUS W/TWL LRG LVL3 (GOWN DISPOSABLE) ×3
KIT BASIN OR (CUSTOM PROCEDURE TRAY) ×2 IMPLANT
KIT TURNOVER KIT B (KITS) ×2 IMPLANT
MESH ULTRAPRO 3X6 7.6X15CM (Mesh General) ×1 IMPLANT
NDL HYPO 25GX1X1/2 BEV (NEEDLE) ×1 IMPLANT
NEEDLE HYPO 25GX1X1/2 BEV (NEEDLE) ×2 IMPLANT
NS IRRIG 1000ML POUR BTL (IV SOLUTION) ×2 IMPLANT
PACK GENERAL/GYN (CUSTOM PROCEDURE TRAY) ×2 IMPLANT
PAD ARMBOARD 7.5X6 YLW CONV (MISCELLANEOUS) ×2 IMPLANT
PENCIL SMOKE EVACUATOR (MISCELLANEOUS) ×1 IMPLANT
SPONGE INTESTINAL PEANUT (DISPOSABLE) IMPLANT
SUT MNCRL AB 4-0 PS2 18 (SUTURE) ×2 IMPLANT
SUT NOVA NAB GS-21 0 18 T12 DT (SUTURE) IMPLANT
SUT PDS AB 0 CT 36 (SUTURE) IMPLANT
SUT SILK 0 SH 30 (SUTURE) IMPLANT
SUT SILK 2 0 SH (SUTURE) IMPLANT
SUT SILK 3 0 (SUTURE)
SUT SILK 3-0 18XBRD TIE 12 (SUTURE) IMPLANT
SUT VIC AB 0 CT2 27 (SUTURE) ×3 IMPLANT
SUT VIC AB 2-0 SH 27 (SUTURE) ×1
SUT VIC AB 2-0 SH 27X BRD (SUTURE) ×1 IMPLANT
SUT VIC AB 3-0 SH 27 (SUTURE) ×1
SUT VIC AB 3-0 SH 27XBRD (SUTURE) ×1 IMPLANT
SUT VICRYL 0 27 CT2 27 ABS (SUTURE) ×1 IMPLANT
SYR CONTROL 10ML LL (SYRINGE) ×2 IMPLANT
TOWEL GREEN STERILE (TOWEL DISPOSABLE) ×2 IMPLANT
TOWEL GREEN STERILE FF (TOWEL DISPOSABLE) ×2 IMPLANT

## 2021-04-26 NOTE — Discharge Instructions (Signed)
May shower beginning 04/27/2021. Do not peel off or scrub skin glue. May allow warm soapy water to run over incision, then rinse and pat dry. Do not soak in any water (tubs, hot tubs, pools, lakes, oceans) for one week.   No lifting greater than 5 pounds for six weeks.   Pain regimen: take over-the-counter tylenol (acetaminophen) 1000mg  every six hours, the prescription ibuprofen (600mg ) every six hours and the robaxin (methocarbamol) 750mg  every six hours. With all three of these, you should be taking something every two hours. Example: tylenol ( acetaminophen) at 8am, ibuprofen at 10am, robaxin (methocarbamol) at 12pm, tylenol (acetaminophen) again at 2pm, ibuprofen again at 4pm, robaxin (methocarbamol) at 6pm. You also have a prescription for oxycodone, which should be taken if the tylenol (acetaminophen), ibuprofen, and robaxin (methocarbamol) are not enough to control your pain. You may take the oxycodone as frequently as every four hours as needed, but if you are taking the other medications as above, you should not need the oxycodone this frequently. You have also been given a prescription for colace (docusate) which is a stool softener. Please take this as prescribed because the oxycodone can cause constipation and the colace (docusate) will minimize or prevent constipation.  Call the office at [phone] for temperature greater than 101.40F, worsening pain, redness or warmth at the incision site.  Please call [phone] to make an appointment for 2-3 weeks after surgery for wound check.

## 2021-04-26 NOTE — Op Note (Signed)
° °  Operative Note   Date: 04/26/2021  Procedure: open right inguinal hernia repair with mesh  Pre-op diagnosis: right inguinal hernia Post-op diagnosis: right, incarcerated, indirect hernia, large cord lipoma  Indication and clinical history: The patient is a 66 y.o. year old male with a right inguinal hernia.  Surgeon: Jesusita Oka, MD Assistant: Zenia Resides, MD  Anesthesiologist: Doroteo Glassman, DO Anesthesia: General  Findings:  Specimen: hernia sac, large cord lipoma EBL: <5cc Drains/Implants: 3x6 ultrapro mesh  Disposition: PACU - hemodynamically stable.  Description of procedure: The patient was positioned supine on the operating room table. General anesthetic induction and intubation were uneventful. Foley catheter insertion was performed and was atraumatic. Time-out was performed verifying correct patient, procedure, laterality, and signature of informed consent. The right groin was prepped and draped in the usual sterile fashion including the use of an ioban.  An incision was made approximately 2/3 distance from the ASIS to the pubic tubercle and deepened through Scarpa's fascia until the external oblique aponeurosis was reached. The external oblique aponeurosis was entered sharply and opened using Metzenbaum scissors, so as to avoid injuring the ilioinguinal nerve. The spermatic cord was isolated and encircled with a Penrose drain. An indirect hernia was clearly identified and high ligation performed after reduction of hernia contents.  A 3x6 ultrapro mesh was cut to fit and sutured to the pubic tubercle with a zero vicryl suture and run along the shelving edge inferiorly and the conjoined tendon superiorly. A slit was made in the center of the mesh to accommodate the cord structures and a vicryl suture was used to create a snug fit around it. The ends of the mesh were overlapped beneath the external oblique aponeurosis. The external oblique was closed with a 2-0 vicryl suture and  Scarpa's closed with a 3-0 vicryl. Local anesthetic was infiltrated into the surrounding tissues. The skin was closed with 4-0 monocryl suture and dermabond applied as dressing.   All sponge and instrument counts were correct at the conclusion of the procedure. The patient was awakened from anesthesia, extubated uneventfully, and transported to PACU - hemodynamically stable.. There were no complications.    Jesusita Oka, MD General and [facility]

## 2021-04-26 NOTE — Anesthesia Procedure Notes (Signed)
Procedure Name: Intubation Date/Time: 04/26/2021 3:00 AM Performed by: Claris Che, CRNA Pre-anesthesia Checklist: Patient identified, Emergency Drugs available, Suction available and Patient being monitored Patient Re-evaluated:Patient Re-evaluated prior to induction Oxygen Delivery Method: Circle System Utilized Preoxygenation: Pre-oxygenation with 100% oxygen Induction Type: IV induction Ventilation: Mask ventilation without difficulty Laryngoscope Size: Miller and 2 Grade View: Grade I Tube type: Oral Tube size: 8.0 mm Number of attempts: 1 Airway Equipment and Method: Stylet and Oral airway Placement Confirmation: ETT inserted through vocal cords under direct vision, positive ETCO2 and breath sounds checked- equal and bilateral Tube secured with: Tape Dental Injury: Teeth and Oropharynx as per pre-operative assessment

## 2021-04-26 NOTE — Transfer of Care (Signed)
Immediate Anesthesia Transfer of Care Note  Patient: Frederick Hicks  Procedure(s) Performed: OPEN RIGHT INGUINAL HERNIA REPAIR WITH MESH (Right: Inguinal) INSERTION OF MESH (Right: Inguinal)  Patient Location: PACU  Anesthesia Type:General  Level of Consciousness: awake, drowsy, patient cooperative and responds to stimulation  Airway & Oxygen Therapy: Patient Spontanous Breathing and Patient connected to nasal cannula oxygen  Post-op Assessment: Report given to RN, Post -op Vital signs reviewed and stable and Patient moving all extremities X 4  Post vital signs: Reviewed and stable  Last Vitals:  Vitals Value Taken Time  BP 144/80 04/26/21 1130  Temp 36.7 C 04/26/21 1130  Pulse 63 04/26/21 1133  Resp 24 04/26/21 1133  SpO2 99 % 04/26/21 1133  Vitals shown include unvalidated device data.  Last Pain:  Vitals:   04/26/21 0819  TempSrc:   PainSc: 0-No pain         Complications: No notable events documented.

## 2021-04-26 NOTE — Anesthesia Postprocedure Evaluation (Signed)
Anesthesia Post Note  Patient: Frederick Hicks  Procedure(s) Performed: OPEN RIGHT INGUINAL HERNIA REPAIR WITH MESH (Right: Inguinal) INSERTION OF MESH (Right: Inguinal)     Patient location during evaluation: PACU Anesthesia Type: General Level of consciousness: awake and alert, oriented and patient cooperative Pain management: pain level controlled Vital Signs Assessment: post-procedure vital signs reviewed and stable Respiratory status: spontaneous breathing, nonlabored ventilation and respiratory function stable Cardiovascular status: blood pressure returned to baseline and stable Postop Assessment: no apparent nausea or vomiting Anesthetic complications: no   No notable events documented.  Last Vitals:  Vitals:   04/26/21 1145 04/26/21 1200  BP: 123/71 122/71  Pulse: (!) 59 68  Resp: 13 14  Temp:  36.8 C  SpO2: 97% 96%    Last Pain:  Vitals:   04/26/21 1200  TempSrc:   PainSc: [place]

## 2021-04-26 NOTE — H&P (Addendum)
° °  Frederick Hicks is an 66 y.o. male.   HPI: 27M with RIH. The patient has had no hospitalizations, doctors visits, ER visits, surgeries, or newly diagnosed allergies since being seen in the office. Patient reports that he shaved the surgical site with a razor this morning and cut himself while shaving.   Past Medical History:  Diagnosis Date   Hiatal hernia    Hyperlipidemia    Hypertension    Pre-diabetes     Past Surgical History:  Procedure Laterality Date   ANKLE FRACTURE SURGERY     left, pinning   COLONOSCOPY  04/01/2015   POLYPECTOMY     TONSILLECTOMY      Family History  Problem Relation Age of Onset   Cancer Father 79       lung 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   Gout Maternal Uncle    Gout Maternal Grandfather    Colon cancer Neg Hx    Esophageal cancer Neg Hx    Rectal cancer Neg Hx    Stomach cancer Neg Hx    Colon polyps Neg Hx     Social History:  reports that he quit smoking about 24 years ago. His smoking use included cigars. He has never used smokeless tobacco. He reports that he does not currently use alcohol. He reports that he does not use drugs.  Allergies:  Allergies  Allergen Reactions   Lisinopril Cough   Poison Ivy Extract     Had systemic urticarial reaction to poison ivy   Simvastatin     Mylagias    Medications: I have reviewed the patient's current medications.  Results for orders placed or performed during the hospital encounter of 04/26/21 (from the past 48 hour(s))  Glucose, capillary     Status: Abnormal   Collection Time: 04/26/21  8:00 AM  Result Value Ref Range   Glucose-Capillary 105 (H) 70 - 99 mg/dL    Comment: Glucose reference range applies only to samples taken after fasting for at least 8 hours.    No results found.  ROS 10 point review of systems is negative except as listed above in HPI.   Physical Exam Blood pressure (!) 168/89, pulse 69, temperature 97.7 F (36.5 C), temperature source Oral,  resp. rate 17, height 6\' 1"  (1.854 m), weight 95.8 kg, SpO2 99 %. Constitutional: well-developed, well-nourished HEENT: pupils equal, round, reactive to light, 76mm b/l, moist conjunctiva, external inspection of ears and nose normal, hearing intact Oropharynx: normal oropharyngeal mucosa, normal dentition Neck: no thyromegaly, trachea midline, no midline cervical tenderness to palpation Chest: breath sounds equal bilaterally, normal respiratory effort, no midline or lateral chest wall tenderness to palpation/deformity Abdomen: soft, NT, no bruising, no hepatosplenomegaly GU:  RIH   Back: no wounds, no thoracic/lumbar spine tenderness to palpation, no thoracic/lumbar spine stepoffs Rectal: deferred Extremities: 2+ radial and pedal pulses bilaterally, intact motor and sensation bilateral UE and LE, no peripheral edema MSK: normal gait/station, no clubbing/cyanosis of fingers/toes, normal ROM of all four extremities Skin: warm, dry, no rashes Psych: normal memory, normal mood/affect     Assessment/Plan: 39M with RIH. Plan for [facility] with mesh. Informed consent was obtained after detailed explanation of risks, including bleeding, infection, hematoma/seroma, decreased fertility, temporary or permanent neuropathy, hernia recurrence, and mesh infection requiring explantation. All questions answered to the patient's satisfaction.  Jesusita Oka, MD General and [facility]

## 2021-04-26 NOTE — Addendum Note (Signed)
Addendum  created 04/26/21 1401 by Claris Che, CRNA   Intraprocedure Meds edited

## 2021-04-27 ENCOUNTER — Encounter (HOSPITAL_COMMUNITY): Payer: Self-pay | Admitting: Surgery

## 2021-04-28 LAB — SURGICAL PATHOLOGY

## 2021-06-10 ENCOUNTER — Ambulatory Visit (INDEPENDENT_AMBULATORY_CARE_PROVIDER_SITE_OTHER): Payer: Medicare HMO | Admitting: Family Medicine

## 2021-06-10 ENCOUNTER — Encounter: Payer: Self-pay | Admitting: Family Medicine

## 2021-06-10 ENCOUNTER — Other Ambulatory Visit: Payer: Self-pay

## 2021-06-10 VITALS — BP 144/98 | HR 62 | Temp 97.7°F | Resp 18 | Ht 73.0 in | Wt 223.0 lb

## 2021-06-10 DIAGNOSIS — H6121 Impacted cerumen, right ear: Secondary | ICD-10-CM | POA: Diagnosis not present

## 2021-06-10 NOTE — Progress Notes (Signed)
Subjective:    Patient ID: Frederick Hicks, male    DOB: June 20, 1955, 66 y.o.   MRN: [id number]  HPI 2 weeks ago, the patient developed head congestion, postnasal drip, rhinorrhea, and pain and pressure in both maxillary sinuses.  He took cetirizine and Xyzal.  Symptoms gradually improved.  He states that he is about 90% better now.  He still has some mild tenderness to palpation over the left maxillary sinus but he is almost back to normal.  He states that this occurs once a year usually around the same time.  He denies any rhinorrhea today, sneezing, itchy watery eyes, cough or chest congestion.  He does report bilateral hearing loss.  He has bilateral cerumen impactions.  We were able to remove the wax from the left auditory canal.  However we were unsuccessful in removing the wax from the right auditory canal.  He would like to see an ENT to have this removed. Past Medical History:  Diagnosis Date   Hiatal hernia    Hyperlipidemia    Hypertension    Pre-diabetes    Past Surgical History:  Procedure Laterality Date   ANKLE FRACTURE SURGERY     left, pinning   COLONOSCOPY  04/01/2015   INGUINAL HERNIA REPAIR Right 04/26/2021   Procedure: OPEN RIGHT INGUINAL HERNIA REPAIR WITH MESH;  Surgeon: Jesusita Oka, MD;  Location: [facility];  Service: General;  Laterality: Right;   INSERTION OF MESH Right 04/26/2021   Procedure: INSERTION OF MESH;  Surgeon: Jesusita Oka, MD;  Location: [facility];  Service: General;  Laterality: Right;   POLYPECTOMY     TONSILLECTOMY     Current Outpatient Medications on File Prior to Visit  Medication Sig Dispense Refill   ALPRAZolam (XANAX) 0.5 MG tablet Take 1 tablet (0.5 mg total) by mouth at bedtime as needed for sleep. 30 tablet 3   aspirin 81 MG tablet Take 81 mg by mouth daily.     atorvastatin (LIPITOR) 10 MG tablet TAKE 1 TABLET BY MOUTH EVERY DAY 90 tablet 1   cetirizine (ZYRTEC) 10 MG tablet Take 10 mg by mouth daily as needed (Itching).     EPINEPHrine 0.3  mg/0.3 mL IJ SOAJ injection Inject 0.3 mg into the skin once.     tadalafil (CIALIS) 20 MG tablet Take 0.5-1 tablets (10-20 mg total) by mouth every other day as needed for erectile dysfunction. 30 tablet 11   triamcinolone ointment (KENALOG) 0.1 % Apply 1 application topically daily as needed for itching.     No current facility-administered medications on file prior to visit.   Allergies  Allergen Reactions   Lisinopril Cough   Poison Ivy Extract     Had systemic urticarial reaction to poison ivy   Simvastatin     Mylagias   Social History   Socioeconomic History   Marital status: Widowed    Spouse name: Not on file   Number of children: Not on file   Years of education: Not on file   Highest education level: Not on file  Occupational History   Not on file  Tobacco Use   Smoking status: Former    Types: Cigars    Quit date: 04/18/1997    Years since quitting: 24.1   Smokeless tobacco: Never  Vaping Use   Vaping Use: Never used  Substance and Sexual Activity   Alcohol use: Not Currently    Comment: maybe one drink a month   Drug use: No   Sexual  activity: Not on file  Other Topics Concern   Not on file  Social History Narrative   Wife passed away--secondary to acute MI   Pt works--binding books   Has son and grandchildren nearby   Social Determinants of Health   Financial Resource Strain: Low Risk    Difficulty of Paying Living Expenses: Not hard at all  Food Insecurity: No Food Insecurity   Worried About Charity fundraiser in the Last Year: Never true   Arboriculturist in the Last Year: Never true  Transportation Needs: No Transportation Needs   Lack of Transportation (Medical): No   Lack of Transportation (Non-Medical): No  Physical Activity: Sufficiently Active   Days of Exercise per Week: 5 days   Minutes of Exercise per Session: 30 min  Stress: No Stress Concern Present   Feeling of Stress : Not at all  Social Connections: Moderately Integrated    Frequency of Communication with Friends and Family: Three times a week   Frequency of Social Gatherings with Friends and Family: More than three times a week   Attends Religious Services: More than 4 times per year   Active Member of Clubs or Organizations: Yes   Attends Archivist Meetings: More than 4 times per year   Marital Status: Widowed  Intimate Partner Violence: Not At Risk   Fear of Current or Ex-Partner: No   Emotionally Abused: No   Physically Abused: No   Sexually Abused: No     Past Surgical History:  Procedure Laterality Date   ANKLE FRACTURE SURGERY     left, pinning   COLONOSCOPY  04/01/2015   INGUINAL HERNIA REPAIR Right 04/26/2021   Procedure: OPEN RIGHT INGUINAL HERNIA REPAIR WITH MESH;  Surgeon: Jesusita Oka, MD;  Location: [facility];  Service: General;  Laterality: Right;   INSERTION OF MESH Right 04/26/2021   Procedure: INSERTION OF MESH;  Surgeon: Jesusita Oka, MD;  Location: [facility];  Service: General;  Laterality: Right;   POLYPECTOMY     TONSILLECTOMY     Current Outpatient Medications on File Prior to Visit  Medication Sig Dispense Refill   ALPRAZolam (XANAX) 0.5 MG tablet Take 1 tablet (0.5 mg total) by mouth at bedtime as needed for sleep. 30 tablet 3   aspirin 81 MG tablet Take 81 mg by mouth daily.     atorvastatin (LIPITOR) 10 MG tablet TAKE 1 TABLET BY MOUTH EVERY DAY 90 tablet 1   cetirizine (ZYRTEC) 10 MG tablet Take 10 mg by mouth daily as needed (Itching).     EPINEPHrine 0.3 mg/0.3 mL IJ SOAJ injection Inject 0.3 mg into the skin once.     tadalafil (CIALIS) 20 MG tablet Take 0.5-1 tablets (10-20 mg total) by mouth every other day as needed for erectile dysfunction. 30 tablet 11   triamcinolone ointment (KENALOG) 0.1 % Apply 1 application topically daily as needed for itching.     No current facility-administered medications on file prior to visit.   Allergies  Allergen Reactions   Lisinopril Cough   Poison Ivy Extract     Had  systemic urticarial reaction to poison ivy   Simvastatin     Mylagias   Social History   Socioeconomic History   Marital status: Widowed    Spouse name: Not on file   Number of children: Not on file   Years of education: Not on file   Highest education level: Not on file  Occupational History  Not on file  Tobacco Use   Smoking status: Former    Types: Cigars    Quit date: 04/18/1997    Years since quitting: 24.1   Smokeless tobacco: Never  Vaping Use   Vaping Use: Never used  Substance and Sexual Activity   Alcohol use: Not Currently    Comment: maybe one drink a month   Drug use: No   Sexual activity: Not on file  Other Topics Concern   Not on file  Social History Narrative   Wife passed away--secondary to acute MI   Pt works--binding books   Has son and grandchildren nearby   Social Determinants of Health   Financial Resource Strain: Low Risk    Difficulty of Paying Living Expenses: Not hard at all  Food Insecurity: No Food Insecurity   Worried About Charity fundraiser in the Last Year: Never true   Arboriculturist in the Last Year: Never true  Transportation Needs: No Transportation Needs   Lack of Transportation (Medical): No   Lack of Transportation (Non-Medical): No  Physical Activity: Sufficiently Active   Days of Exercise per Week: 5 days   Minutes of Exercise per Session: 30 min  Stress: No Stress Concern Present   Feeling of Stress : Not at all  Social Connections: Moderately Integrated   Frequency of Communication with Friends and Family: Three times a week   Frequency of Social Gatherings with Friends and Family: More than three times a week   Attends Religious Services: More than 4 times per year   Active Member of Clubs or Organizations: Yes   Attends Archivist Meetings: More than 4 times per year   Marital Status: Widowed  Human resources officer Violence: Not At Risk   Fear of Current or Ex-Partner: No   Emotionally Abused: No    Physically Abused: No   Sexually Abused: No   Family History  Problem Relation Age of Onset   Cancer Father 31       lung 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   Gout Maternal Uncle    Gout Maternal Grandfather    Colon cancer Neg Hx    Esophageal cancer Neg Hx    Rectal cancer Neg Hx    Stomach cancer Neg Hx    Colon polyps Neg Hx      Review of Systems  All other systems reviewed and are negative.     Objective:   Physical Exam Vitals reviewed.  Constitutional:      General: He is not in acute distress.    Appearance: Normal appearance. He is normal weight. He is not ill-appearing, toxic-appearing or diaphoretic.  HENT:     Right Ear: There is impacted cerumen.     Left Ear: There is impacted cerumen.     Nose: Congestion present. No rhinorrhea.     Right Sinus: No maxillary sinus tenderness or frontal sinus tenderness.     Left Sinus: Maxillary sinus tenderness present. No frontal sinus tenderness.     Mouth/Throat:     Pharynx: No oropharyngeal exudate.  Eyes:     Conjunctiva/sclera: Conjunctivae normal.  Neck:     Vascular: No carotid bruit.  Cardiovascular:     Rate and Rhythm: Normal rate and regular rhythm.     Heart sounds: Normal heart sounds.  Pulmonary:     Effort: Pulmonary effort is normal. No respiratory distress.     Breath sounds: Normal breath sounds. No wheezing, rhonchi or rales.  Abdominal:     General: Bowel sounds are normal.     Palpations: Abdomen is soft.  Musculoskeletal:     Cervical back: Neck supple.     Right lower leg: No edema.     Left lower leg: No edema.  Lymphadenopathy:     Cervical: No cervical adenopathy.  Neurological:     Mental Status: He is alert.          Assessment & Plan:  Impacted cerumen of right ear - Plan: Ambulatory referral to ENT I believe the patient likely had a viral upper respiratory infection that turned into a sinus infection however is 90% better now and resolving on its own.   Therefore no further treatment is necessary.  I do not feel that he requires chronic allergy medication unless this becomes a chronic problem.  I will consult ENT to help remove the wax from his right auditory canal.

## 2021-06-19 ENCOUNTER — Other Ambulatory Visit: Payer: Self-pay | Admitting: Family Medicine

## 2021-07-15 DIAGNOSIS — H6123 Impacted cerumen, bilateral: Secondary | ICD-10-CM | POA: Diagnosis not present

## 2021-07-15 DIAGNOSIS — H903 Sensorineural hearing loss, bilateral: Secondary | ICD-10-CM | POA: Diagnosis not present

## 2021-07-19 ENCOUNTER — Ambulatory Visit (INDEPENDENT_AMBULATORY_CARE_PROVIDER_SITE_OTHER): Payer: Medicare HMO | Admitting: Family Medicine

## 2021-07-19 ENCOUNTER — Ambulatory Visit
Admission: RE | Admit: 2021-07-19 | Discharge: 2021-07-19 | Disposition: A | Discharge status: Home / Self Care | Payer: Medicare HMO | Source: Ambulatory Visit | Attending: Family Medicine | Admitting: Family Medicine

## 2021-07-19 VITALS — BP 142/72 | HR 62 | Temp 97.2°F | Ht 73.0 in | Wt 224.8 lb

## 2021-07-19 DIAGNOSIS — M25572 Pain in left ankle and joints of left foot: Secondary | ICD-10-CM

## 2021-07-19 MED ORDER — MELOXICAM 15 MG PO TABS: 15.0000 mg | ORAL_TABLET | Freq: Every day | ORAL | 0 refills | Status: DC

## 2021-07-19 NOTE — Progress Notes (Signed)
? ?Subjective:  ? ? Patient ID: Frederick Hicks, male    DOB: 1955-12-06, 66 y.o.   MRN: [id number] ? ?Ankle Pain  ? ?Patient is a very pleasant 66 year old Caucasian gentleman who presents today complaining of pain in his left ankle.  When he was 66 years old he suffered a fracture of his left ankle.  He underwent ORIF and had "pins put in place.  Sounds like the patient had his distal tibia fracture in addition to the distal malleolus and required pins and hardware to reinforce the joint.  3 weeks ago, the patient was stepping off a ladder when he landed awkwardly on his left foot twisting his left ankle.  Ever since that time, the patient has had pain and tenderness over his distal posterior lateral malleolus.  He also has numbness over the dorsum of his left foot following the exact distribution of the superficial peroneal nerve.  He has normal flexion and extension of the foot as well as inversion and eversion. ?Past Medical History:  ?Diagnosis Date  ? Hiatal hernia   ? Hyperlipidemia   ? Hypertension   ? Pre-diabetes   ? ?Past Surgical History:  ?Procedure Laterality Date  ? ANKLE FRACTURE SURGERY    ? left, pinning  ? COLONOSCOPY  04/01/2015  ? INGUINAL HERNIA REPAIR Right 04/26/2021  ? Procedure: OPEN RIGHT INGUINAL HERNIA REPAIR WITH MESH;  Surgeon: Jesusita Oka, MD;  Location: [facility];  Service: General;  Laterality: Right;  ? INSERTION OF MESH Right 04/26/2021  ? Procedure: INSERTION OF MESH;  Surgeon: Jesusita Oka, MD;  Location: [facility];  Service: General;  Laterality: Right;  ? POLYPECTOMY    ? TONSILLECTOMY    ? ?Current Outpatient Medications on File Prior to Visit  ?Medication Sig Dispense Refill  ? ALPRAZolam (XANAX) 0.5 MG tablet Take 1 tablet (0.5 mg total) by mouth at bedtime as needed for sleep. 30 tablet 3  ? aspirin 81 MG tablet Take 81 mg by mouth daily.    ? atorvastatin (LIPITOR) 10 MG tablet TAKE 1 TABLET BY MOUTH EVERY DAY 90 tablet 1  ? cetirizine (ZYRTEC) 10 MG tablet Take 10 mg by mouth  daily as needed (Itching).    ? EPINEPHrine 0.3 mg/0.3 mL IJ SOAJ injection Inject 0.3 mg into the skin once.    ? tadalafil (CIALIS) 20 MG tablet Take 0.5-1 tablets (10-20 mg total) by mouth every other day as needed for erectile dysfunction. 30 tablet 11  ? triamcinolone ointment (KENALOG) 0.1 % Apply 1 application topically daily as needed for itching.    ? ?No current facility-administered medications on file prior to visit.  ? ?Allergies  ?Allergen Reactions  ? Lisinopril Cough  ? Poison Ivy Extract   ?  Had systemic urticarial reaction to poison ivy  ? Simvastatin   ?  Mylagias  ? ?Social History  ? ?Socioeconomic History  ? Marital status: Widowed  ?  Spouse name: Not on file  ? Number of children: Not on file  ? Years of education: Not on file  ? Highest education level: Not on file  ?Occupational History  ? Not on file  ?Tobacco Use  ? Smoking status: Former  ?  Types: Cigars  ?  Quit date: 04/18/1997  ?  Years since quitting: 24.2  ? Smokeless tobacco: Never  ?Vaping Use  ? Vaping Use: Never used  ?Substance and Sexual Activity  ? Alcohol use: Not Currently  ?  Comment: maybe one drink a  month  ? Drug use: No  ? Sexual activity: Not on file  ?Other Topics Concern  ? Not on file  ?Social History Narrative  ? Wife passed away--secondary to acute MI  ? Pt works--binding books  ? Has son and grandchildren nearby  ? ?Social Determinants of Health  ? ?Financial Resource Strain: Low Risk   ? Difficulty of Paying Living Expenses: Not hard at all  ?Food Insecurity: No Food Insecurity  ? Worried About Charity fundraiser in the Last Year: Never true  ? Ran Out of Food in the Last Year: Never true  ?Transportation Needs: No Transportation Needs  ? Lack of Transportation (Medical): No  ? Lack of Transportation (Non-Medical): No  ?Physical Activity: Sufficiently Active  ? Days of Exercise per Week: 5 days  ? Minutes of Exercise per Session: 30 min  ?Stress: No Stress Concern Present  ? Feeling of Stress : Not at all   ?Social Connections: Moderately Integrated  ? Frequency of Communication with Friends and Family: Three times a week  ? Frequency of Social Gatherings with Friends and Family: More than three times a week  ? Attends Religious Services: More than 4 times per year  ? Active Member of Clubs or Organizations: Yes  ? Attends Archivist Meetings: More than 4 times per year  ? Marital Status: Widowed  ?Intimate Partner Violence: Not At Risk  ? Fear of Current or Ex-Partner: No  ? Emotionally Abused: No  ? Physically Abused: No  ? Sexually Abused: No  ? ?Family History  ?Problem Relation Age of Onset  ? Cancer Father 61  ?     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 Gout Maternal Uncle   ? Gout Maternal Grandfather   ? Colon cancer Neg Hx   ? Esophageal cancer Neg Hx   ? Rectal cancer Neg Hx   ? Stomach cancer Neg Hx   ? Colon polyps Neg Hx   ? ? ? ?Review of Systems ? ?   ?Objective:  ? Physical Exam ?Vitals reviewed.  ?Constitutional:   ?   Appearance: Normal appearance. He is normal weight.  ?Neck:  ?   Vascular: No carotid bruit.  ?Cardiovascular:  ?   Rate and Rhythm: Normal rate and regular rhythm.  ?   Heart sounds: Normal heart sounds.  ?Pulmonary:  ?   Effort: Pulmonary effort is normal.  ?   Breath sounds: Normal breath sounds.  ?Musculoskeletal:  ?   Cervical back: Neck supple.  ?   Right lower leg: No edema.  ?   Left lower leg: No edema.  ?   Left ankle: Swelling and ecchymosis present. Normal range of motion.  ?     Feet: ? ?Lymphadenopathy:  ?   Cervical: No cervical adenopathy.  ?Neurological:  ?   Mental Status: He is alert.  ? ? ? ? ? ?   ?Assessment & Plan:  ?Acute left ankle pain - Plan: DG Ankle Complete Left ?I believe the patient sprained his left ankle and due to the resultant swelling, I believe the patient has inflammation in the superficial peroneal nerve on the dorsum near the extensor retinaculum causing numbness.  I anticipate that this will gradually resolve on its  own and improved with anti-inflammatories started him on meloxicam 15 mg daily.  I will obtain an x-ray of the ankle to rule out any damage to the hardware or distal malleolus. ?

## 2021-07-22 ENCOUNTER — Ambulatory Visit: Payer: Medicare HMO | Admitting: Family Medicine

## 2021-07-28 ENCOUNTER — Other Ambulatory Visit: Payer: Self-pay

## 2021-07-28 DIAGNOSIS — M25579 Pain in unspecified ankle and joints of unspecified foot: Secondary | ICD-10-CM

## 2021-08-05 ENCOUNTER — Ambulatory Visit: Payer: Medicare HMO | Admitting: Orthopedic Surgery

## 2021-08-05 DIAGNOSIS — M25572 Pain in left ankle and joints of left foot: Secondary | ICD-10-CM

## 2021-08-08 ENCOUNTER — Encounter: Payer: Self-pay | Admitting: Orthopedic Surgery

## 2021-08-08 NOTE — Progress Notes (Signed)
? ?Office Visit Note ?  ?Patient: Frederick Hicks           ?Date of Birth: 1955-06-11           ?MRN: [id number] ?Visit Date: 08/05/2021 ?             ?Requested by: Susy Frizzle, MD ?[address] [place],  [place] [address] ?PCP: Susy Frizzle, MD ? ?Chief Complaint  ?Patient presents with  ? Left Ankle - Pain  ? ? ? ? ?HPI: ?Patient is a 66 year old gentleman who states that when he was 41 he had surgery on his ankle has 1 retained screw.  Patient states that he has numbness over the lateral ankle.  Patient states that recently he missed a step on a ladder and has pain over the lateral ankle joint.  This occurred 1 month ago. ? ?Assessment & Plan: ?Visit Diagnoses:  ?1. Pain in left ankle and joints of left foot   ? ? ?Plan: Discussed that the syndesmosis is calcified there would be no advantage to removing the broken screw.  Recommended ankle stabilizing orthosis ankle strengthening. ? ?Follow-Up Instructions: No follow-ups on file.  ? ?Ortho Exam ? ?Patient is alert, oriented, no adenopathy, well-dressed, normal affect, normal respiratory effort. ?Examination patient has a good pulse.  He has decreased range of motion of the ankle.  He is maximally tender to palpation over the anterior talofibular ligament.  Anterior drawer is stable.  He does have some tenderness over the screw head laterally.  Review of his radiographs shows widening of the medial joint space with complete calcification of the syndesmosis with the retained screw broken at the fusion site.  No lucency around the screw no signs of infection. ? ?Imaging: ?No results found. ?No images are attached to the encounter. ? ?Labs: ?Lab Results  ?Component Value Date  ? HGBA1C 6.0 (H) 04/15/2021  ? HGBA1C 5.7 (H) 11/20/2020  ? HGBA1C 5.6 05/29/2013  ? ESRSEDRATE 4 07/29/2016  ? CRP 3.3 07/29/2016  ? LABURIC 5.3 07/29/2016  ? LABURIC 6.2 05/29/2013  ? ? ? ?Lab Results  ?Component Value Date  ? ALBUMIN 4.4 02/11/2015  ? ALBUMIN 4.5 05/29/2013   ? ? ?No results found for: MG ?Lab Results  ?Component Value Date  ? VD25OH 48 11/16/2017  ? VD25OH 29 (L) 05/09/2016  ? VD25OH 32 11/02/2015  ? ? ?No results found for: PREALBUMIN ? ?  Latest Ref Rng & Units 04/15/2021  ? 11:43 AM 11/20/2020  ? 11:01 AM 05/14/2020  ? 11:04 AM  ?CBC EXTENDED  ?WBC 4.0 - 10.5 K/uL 8.3   7.2   6.6    ?RBC 4.22 - 5.81 MIL/uL 5.06   4.72   4.76    ?Hemoglobin 13.0 - 17.0 g/dL 15.3   14.2   14.8    ?HCT 39.0 - 52.0 % 46.3   43.8   43.1    ?Platelets 150 - 400 K/uL 241   271   263    ?NEUT# 1,500 - 7,800 cells/uL  4,558   3,835    ?Lymph# 850 - 3,900 cells/uL  1,843   2,033    ? ? ? ?There is no height or weight on file to calculate BMI. ? ?Orders:  ?No orders of the defined types were placed in this encounter. ? ?No orders of the defined types were placed in this encounter. ? ? ? Procedures: ?No procedures performed ? ?Clinical Data: ?No additional findings. ? ?ROS: ? ?  All other systems negative, except as noted in the HPI. ?Review of Systems ? ?Objective: ?Vital Signs: There were no vitals taken for this visit. ? ?Specialty Comments:  ?No specialty comments available. ? ?PMFS History: ?Patient Active Problem List  ? Diagnosis Date Noted  ? Prostate cancer screening 05/09/2016  ? Vitamin D deficiency 03/04/2015  ? Essential hypertension 02/11/2015  ? Dyslipidemia 02/11/2015  ? ?Past Medical History:  ?Diagnosis Date  ? Hiatal hernia   ? Hyperlipidemia   ? Hypertension   ? Pre-diabetes   ?  ?Family History  ?Problem Relation Age of Onset  ? Cancer Father 7  ?     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 Gout Maternal Uncle   ? Gout Maternal Grandfather   ? Colon cancer Neg Hx   ? Esophageal cancer Neg Hx   ? Rectal cancer Neg Hx   ? Stomach cancer Neg Hx   ? Colon polyps Neg Hx   ?  ?Past Surgical History:  ?Procedure Laterality Date  ? ANKLE FRACTURE SURGERY    ? left, pinning  ? COLONOSCOPY  04/01/2015  ? INGUINAL HERNIA REPAIR Right 04/26/2021  ? Procedure: OPEN RIGHT  INGUINAL HERNIA REPAIR WITH MESH;  Surgeon: Jesusita Oka, MD;  Location: [facility];  Service: General;  Laterality: Right;  ? INSERTION OF MESH Right 04/26/2021  ? Procedure: INSERTION OF MESH;  Surgeon: Jesusita Oka, MD;  Location: [facility];  Service: General;  Laterality: Right;  ? POLYPECTOMY    ? TONSILLECTOMY    ? ?Social History  ? ?Occupational History  ? Not on file  ?Tobacco Use  ? Smoking status: Former  ?  Types: Cigars  ?  Quit date: 04/18/1997  ?  Years since quitting: 24.3  ? Smokeless tobacco: Never  ?Vaping Use  ? Vaping Use: Never used  ?Substance and Sexual Activity  ? Alcohol use: Not Currently  ?  Comment: maybe one drink a month  ? Drug use: No  ? Sexual activity: Not on file  ? ? ? ? ? ?

## 2021-08-16 ENCOUNTER — Other Ambulatory Visit: Payer: Self-pay | Admitting: Family Medicine

## 2021-09-10 ENCOUNTER — Telehealth: Payer: Self-pay

## 2021-09-10 ENCOUNTER — Ambulatory Visit (INDEPENDENT_AMBULATORY_CARE_PROVIDER_SITE_OTHER): Payer: Medicare HMO

## 2021-09-10 VITALS — BP 112/62 | HR 62 | Ht 73.0 in | Wt 220.0 lb

## 2021-09-10 DIAGNOSIS — Z0000 Encounter for general adult medical examination without abnormal findings: Secondary | ICD-10-CM | POA: Diagnosis not present

## 2021-09-10 NOTE — Patient Instructions (Signed)
Frederick Hicks , Thank you for taking time to come for your Medicare Wellness Visit. I appreciate your ongoing commitment to your health goals. Please review the following plan we discussed and let me know if I can assist you in the future.   Screening recommendations/referrals: Colonoscopy: Done 12/25/2018 Repeat in 5 years  Recommended yearly ophthalmology/optometry visit for glaucoma screening and checkup Recommended yearly dental visit for hygiene and checkup  Vaccinations: Influenza vaccine: Due Fall 2023. Pneumococcal vaccine: Done 09/03/2020. Tdap vaccine: Done 02/27/2015 Repeat in 10 years  Shingles vaccine: Done 02/25/2021.   Covid-19: Done 12/13/2020 05/15/2020 07/07/2019, 06/26/2019 and 04/09/2021.  Advanced directives: Please bring a copy of your health care power of attorney and living will to the office to be added to your chart at your convenience.   Conditions/risks identified: KEEP UP THE GOOD WORK!!  Next appointment: Follow up in one year for your annual wellness visit. 2024.  Preventive Care 67 Years and Older, Male  Preventive care refers to lifestyle choices and visits with your health care provider that can promote health and wellness. What does preventive care include? A yearly physical exam. This is also called an annual well check. Dental exams once or twice a year. Routine eye exams. Ask your health care provider how often you should have your eyes checked. Personal lifestyle choices, including: Daily care of your teeth and gums. Regular physical activity. Eating a healthy diet. Avoiding tobacco and drug use. Limiting alcohol use. Practicing safe sex. Taking low doses of aspirin every day. Taking vitamin and mineral supplements as recommended by your health care provider. What happens during an annual well check? The services and screenings done by your health care provider during your annual well check will depend on your age, overall health, lifestyle risk  factors, and family history of disease. Counseling  Your health care provider may ask you questions about your: Alcohol use. Tobacco use. Drug use. Emotional well-being. Home and relationship well-being. Sexual activity. Eating habits. History of falls. Memory and ability to understand (cognition). Work and work Statistician. Screening  You may have the following tests or measurements: Height, weight, and BMI. Blood pressure. Lipid and cholesterol levels. These may be checked every 5 years, or more frequently if you are over 67 years old. Skin check. Lung cancer screening. You may have this screening every year starting at age 67 if you have a 30-pack-year history of smoking and currently smoke or have quit within the past 15 years. Fecal occult blood test (FOBT) of the stool. You may have this test every year starting at age 67. Flexible sigmoidoscopy or colonoscopy. You may have a sigmoidoscopy every 5 years or a colonoscopy every 10 years starting at age 67. Prostate cancer screening. Recommendations will vary depending on your family history and other risks. Hepatitis C blood test. Hepatitis B blood test. Sexually transmitted disease (STD) testing. Diabetes screening. This is done by checking your blood sugar (glucose) after you have not eaten for a while (fasting). You may have this done every 1-3 years. Abdominal aortic aneurysm (AAA) screening. You may need this if you are a current or former smoker. Osteoporosis. You may be screened starting at age 67 if you are at high risk. Talk with your health care provider about your test results, treatment options, and if necessary, the need for more tests. Vaccines  Your health care provider may recommend certain vaccines, such as: Influenza vaccine. This is recommended every year. Tetanus, diphtheria, and acellular pertussis (Tdap, Td) vaccine.  You may need a Td booster every 10 years. Zoster vaccine. You may need this after age  67. Pneumococcal 13-valent conjugate (PCV13) vaccine. One dose is recommended after age 67. Pneumococcal polysaccharide (PPSV23) vaccine. One dose is recommended after age 67. Talk to your health care provider about which screenings and vaccines you need and how often you need them. This information is not intended to replace advice given to you by your health care provider. Make sure you discuss any questions you have with your health care provider. Document Released: 05/01/2015 Document Revised: 12/23/2015 Document Reviewed: 02/03/2015 Elsevier Interactive Patient Education  2017 [place] Prevention in the Home Falls can cause injuries. They can happen to people of all ages. There are many things you can do to make your home safe and to help prevent falls. What can I do on the outside of my home? Regularly fix the edges of walkways and driveways and fix any cracks. Remove anything that might make you trip as you walk through a door, such as a raised step or threshold. Trim any bushes or trees on the path to your home. Use bright outdoor lighting. Clear any walking paths of anything that might make someone trip, such as rocks or tools. Regularly check to see if handrails are loose or broken. Make sure that both sides of any steps have handrails. Any raised decks and porches should have guardrails on the edges. Have any leaves, snow, or ice cleared regularly. Use sand or salt on walking paths during winter. Clean up any spills in your garage right away. This includes oil or grease spills. What can I do in the bathroom? Use night lights. Install grab bars by the toilet and in the tub and shower. Do not use towel bars as grab bars. Use non-skid mats or decals in the tub or shower. If you need to sit down in the shower, use a plastic, non-slip stool. Keep the floor dry. Clean up any water that spills on the floor as soon as it happens. Remove soap buildup in the tub or shower  regularly. Attach bath mats securely with double-sided non-slip rug tape. Do not have throw rugs and other things on the floor that can make you trip. What can I do in the bedroom? Use night lights. Make sure that you have a light by your bed that is easy to reach. Do not use any sheets or blankets that are too big for your bed. They should not hang down onto the floor. Have a firm chair that has side arms. You can use this for support while you get dressed. Do not have throw rugs and other things on the floor that can make you trip. What can I do in the kitchen? Clean up any spills right away. Avoid walking on wet floors. Keep items that you use a lot in easy-to-reach places. If you need to reach something above you, use a strong step stool that has a grab bar. Keep electrical cords out of the way. Do not use floor polish or wax that makes floors slippery. If you must use wax, use non-skid floor wax. Do not have throw rugs and other things on the floor that can make you trip. What can I do with my stairs? Do not leave any items on the stairs. Make sure that there are handrails on both sides of the stairs and use them. Fix handrails that are broken or loose. Make sure that handrails are as long as  the stairways. Check any carpeting to make sure that it is firmly attached to the stairs. Fix any carpet that is loose or worn. Avoid having throw rugs at the top or bottom of the stairs. If you do have throw rugs, attach them to the floor with carpet tape. Make sure that you have a light switch at the top of the stairs and the bottom of the stairs. If you do not have them, ask someone to add them for you. What else can I do to help prevent falls? Wear shoes that: Do not have high heels. Have rubber bottoms. Are comfortable and fit you well. Are closed at the toe. Do not wear sandals. If you use a stepladder: Make sure that it is fully opened. Do not climb a closed stepladder. Make sure that  both sides of the stepladder are locked into place. Ask someone to hold it for you, if possible. Clearly mark and make sure that you can see: Any grab bars or handrails. First and last steps. Where the edge of each step is. Use tools that help you move around (mobility aids) if they are needed. These include: Canes. Walkers. Scooters. Crutches. Turn on the lights when you go into a dark area. Replace any light bulbs as soon as they burn out. Set up your furniture so you have a clear path. Avoid moving your furniture around. If any of your floors are uneven, fix them. If there are any pets around you, be aware of where they are. Review your medicines with your doctor. Some medicines can make you feel dizzy. This can increase your chance of falling. Ask your doctor what other things that you can do to help prevent falls. This information is not intended to replace advice given to you by your health care provider. Make sure you discuss any questions you have with your health care provider. Document Released: 01/29/2009 Document Revised: 09/10/2015 Document Reviewed: 05/09/2014 Elsevier Interactive Patient Education  2017 [employer].

## 2021-09-10 NOTE — Progress Notes (Signed)
Subjective:   Frederick Hicks is a 66 y.o. male who presents for Medicare Annual/Subsequent preventive examination.  Review of Systems     Cardiac Risk Factors include: advanced age (>32mn, >>76women);hypertension;dyslipidemia;male gender;sedentary lifestyle     Objective:    Today's Vitals   09/10/21 1408  BP: 112/62  Pulse: 62  SpO2: 99%  Weight: 220 lb (99.8 kg)  Height: '6\' 1"'$  (1.854 m)   Body mass index is 29.03 kg/m.     09/10/2021    2:47 PM 04/15/2021   11:36 AM 09/03/2020    2:24 PM 04/01/2015    1:10 PM 03/19/2015    2:29 PM  Advanced Directives  Does Patient Have a Medical Advance Directive? Yes Yes No No No  Type of AParamedicof ACordovaLiving will HFisherLiving will     Does patient want to make changes to medical advance directive?  No - Patient declined     Copy of HBeckwourthin Chart? Yes - validated most recent copy scanned in chart (See row information) No - copy requested     Would patient like information on creating a medical advance directive?   No - Patient declined No - patient declined information No - patient declined information    Current Medications (verified) Outpatient Encounter Medications as of 09/10/2021  Medication Sig   ALPRAZolam (XANAX) 0.5 MG tablet Take 1 tablet (0.5 mg total) by mouth at bedtime as needed for sleep.   aspirin 81 MG tablet Take 81 mg by mouth daily.   atorvastatin (LIPITOR) 10 MG tablet TAKE 1 TABLET BY MOUTH EVERY DAY   cetirizine (ZYRTEC) 10 MG tablet Take 10 mg by mouth daily as needed (Itching).   EPINEPHrine 0.3 mg/0.3 mL IJ SOAJ injection Inject 0.3 mg into the skin once.   meloxicam (MOBIC) 15 MG tablet TAKE 1 TABLET (15 MG TOTAL) BY MOUTH DAILY.   SHINGRIX injection    tadalafil (CIALIS) 20 MG tablet Take 0.5-1 tablets (10-20 mg total) by mouth every other day as needed for erectile dysfunction.   triamcinolone ointment (KENALOG) 0.1 % Apply 1  application topically daily as needed for itching.   No facility-administered encounter medications on file as of 09/10/2021.    Allergies (verified) Lisinopril, Poison ivy extract, and Simvastatin   History: Past Medical History:  Diagnosis Date   Hiatal hernia    Hyperlipidemia    Hypertension    Pre-diabetes    Past Surgical History:  Procedure Laterality Date   ANKLE FRACTURE SURGERY     left, pinning   COLONOSCOPY  04/01/2015   INGUINAL HERNIA REPAIR Right 04/26/2021   Procedure: OPEN RIGHT INGUINAL HERNIA REPAIR WITH MESH;  Surgeon: LJesusita Oka MD;  [place] [facility]  Service: General;  Laterality: Right;   INSERTION OF MESH Right 04/26/2021   Procedure: INSERTION OF MESH;  Surgeon: LJesusita Oka MD;  [facility]  Service: General;  Laterality: Right;   POLYPECTOMY     TONSILLECTOMY     Family History  Problem Relation Age of Onset   Cancer Father        lung 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   Gout Maternal Uncle    Gout Maternal Grandfather    Colon cancer Neg Hx    Esophageal cancer Neg Hx    Rectal cancer Neg Hx    Stomach cancer Neg Hx    Colon polyps Neg Hx    Social History  Socioeconomic History   Marital status: Widowed    Spouse name: Not on file   Number of children: 2   Years of education: Not on file   Highest education level: Not on file  Occupational History   Occupation: Engineering  Tobacco Use   Smoking status: Former    Types: Cigars    Quit date: 04/18/1997    Years since quitting: 24.4   Smokeless tobacco: Never  Vaping Use   Vaping Use: Never used  Substance and Sexual Activity   Alcohol use: Yes    Alcohol/week: 3.0 standard drinks    Types: 1 Glasses of wine, 2 Cans of beer per week    Comment: maybe one drink a month   Drug use: No   Sexual activity: Yes  Other Topics Concern   Not on file  Social History Narrative   Wife passed away--secondary to acute MI   Pt works--binding books, [employer]    Has son and grandchildren nearby   Currently engaged to Crystal, wedding date set for 04/09/2022.   Social Determinants of Health   Financial Resource Strain: Low Risk    Difficulty of Paying Living Expenses: Not hard at all  Food Insecurity: No Food Insecurity   Worried About Charity fundraiser in the Last Year: Never true   [place] in the Last Year: Never true  Transportation Needs: No Transportation Needs   Lack of Transportation (Medical): No   Lack of Transportation (Non-Medical): No  Physical Activity: Sufficiently Active   Days of Exercise per Week: 5 days   Minutes of Exercise per Session: 30 min  Stress: No Stress Concern Present   Feeling of Stress : Not at all  Social Connections: Socially Integrated   Frequency of Communication with Friends and Family: More than three times a week   Frequency of Social Gatherings with Friends and Family: More than three times a week   Attends Religious Services: More than 4 times per year   Active Member of [employer] or Organizations: Yes   Attends Music therapist: More than 4 times per year   Marital Status: Living with partner    Tobacco Counseling Counseling given: Not Answered   Clinical Intake:  Pre-visit preparation completed: Yes  Pain : No/denies pain     BMI - recorded: 29.03 Nutritional Status: BMI 25 -29 Overweight Nutritional Risks: None Diabetes: No  How often do you need to have someone help you when you read instructions, pamphlets, or other written materials from your doctor or pharmacy?: 1 - Never  Diabetic?NO  Interpreter Needed?: No  Information entered by :: [email or web address]   Activities of Daily Living    09/10/2021    2:29 PM 04/15/2021   11:39 AM  In your present state of health, do you have any difficulty performing the following activities:  Hearing? 1   Vision? 0   Difficulty concentrating or making decisions? 0   Walking or climbing stairs? 0   Dressing or bathing? 0    Doing errands, shopping? 0 0  Preparing Food and eating ? N   Using the Toilet? N   In the past six months, have you accidently leaked urine? N   Do you have problems with loss of bowel control? N   Managing your Medications? N   Managing your Finances? N   Housekeeping or managing your Housekeeping? N     Patient Care Team: Susy Frizzle, MD as PCP - General (Family  Medicine)  Indicate any recent Medical Services you may have received from other than [facility] providers in the past year (date may be approximate).     Assessment:   This is a routine wellness examination for [place].  Hearing/Vision screen Hearing Screening - Comments:: Hearing issues, higher frequecy.  Vision Screening - Comments:: Glasses. 06/2021.  Dietary issues and exercise activities discussed: Current Exercise Habits: The patient has a physically strenuous job, but has no regular exercise apart from work., Exercise limited by: cardiac condition(s)   Goals Addressed             This Visit's Progress    Patient Stated   On track    I would like to maintain my current health        Depression Screen    09/10/2021    2:14 PM 09/03/2020    2:30 PM 05/14/2020   10:29 AM 11/20/2018    9:24 AM 11/16/2017   10:30 AM 03/23/2017    2:05 PM 07/29/2016    3:21 PM  PHQ 2/9 Scores  PHQ - 2 Score 0 0 0 0 0 0 0  PHQ- 9 Score       0    Fall Risk    09/10/2021    2:24 PM 09/03/2020    2:27 PM 05/14/2020   10:29 AM 11/16/2017   10:30 AM 03/23/2017    2:05 PM  Fall Risk   Falls in the past year? 0 0 0 No No  Number falls in past yr: 0 0 0    Injury with Fall? 0 0 0    Risk for fall due to : No Fall Risks No Fall Risks     Follow up Falls prevention discussed Falls evaluation completed;Falls prevention discussed Falls evaluation completed      FALL RISK PREVENTION PERTAINING TO THE HOME:  Any stairs in or around the home? Yes  If so, are there any without handrails? No  Home free of loose throw rugs in  walkways, pet beds, electrical cords, etc? Yes  Adequate lighting in your home to reduce risk of falls? Yes   ASSISTIVE DEVICES UTILIZED TO PREVENT FALLS:  Life alert? No  Use of a cane, walker or w/c? No  Grab bars in the bathroom? No  Shower chair or bench in shower? No  Elevated toilet seat or a handicapped toilet? No   TIMED UP AND GO:  Was the test performed? Yes .  Length of time to ambulate 10 feet: 10 sec.   Gait steady and fast without use of assistive device  Cognitive Function:        09/10/2021    2:29 PM  6CIT Screen  What Year? 0 points  What month? 0 points  What time? 0 points  Count back from 20 0 points  Months in reverse 0 points  Repeat phrase 4 points  Total Score 4 points    Immunizations Immunization History  Administered Date(s) Administered   Influenza,inj,Quad PF,6+ Mos 02/27/2015, 05/09/2016, 03/23/2017, 05/07/2018   PFIZER Comirnaty(Gray Top)Covid-19 Tri-Sucrose Vaccine 06/26/2019, 07/17/2019, 05/15/2020, 12/13/2020   Pfizer Covid-19 Vaccine Bivalent Booster 3yr & up 04/09/2021   Pneumococcal Polysaccharide-23 09/03/2020   Tdap 02/27/2015   Zoster Recombinat (Shingrix) 02/25/2021, 06/15/2021    TDAP status: Up to date  Flu Vaccine status: Up to date  Pneumococcal vaccine status: Due, Education has been provided regarding the importance of this vaccine. Advised may receive this vaccine at local pharmacy or Health Dept. Aware to  provide a copy of the vaccination record if obtained from local pharmacy or Health Dept. Verbalized acceptance and understanding.  Covid-19 vaccine status: Completed vaccines  Qualifies for Shingles Vaccine? Yes   Zostavax completed Yes   Shingrix Completed?: Yes  Screening Tests Health Maintenance  Topic Date Due   Pneumonia Vaccine 49+ Years old (2 - PCV) 04/15/2022 (Originally 09/03/2021)   Hepatitis C Screening  04/15/2022 (Originally 05/27/1973)   INFLUENZA VACCINE  11/16/2021   COLONOSCOPY (Pts  45-25yr Insurance coverage will need to be confirmed)  12/25/2023   TETANUS/TDAP  02/26/2025   COVID-19 Vaccine  Completed   Zoster Vaccines- Shingrix  Completed   HPV VACCINES  Aged Out    Health Maintenance  There are no preventive care reminders to display for this patient.   Colorectal cancer screening: Type of screening: Colonoscopy. Completed 12/25/2018. Repeat every 5 years  Lung Cancer Screening: (Low Dose CT Chest recommended if Age 67-80years, 30 pack-year currently smoking OR have quit w/in 15years.) does not qualify.  Additional Screening:  Hepatitis C Screening: does qualify; Completed DUE  Vision Screening: Recommended annual ophthalmology exams for early detection of glaucoma and other disorders of the eye. Is the patient up to date with their annual eye exam?  Yes  Who is the provider or what is the name of the office in which the patient attends annual eye exams? DR. TCamillo FlamingIf pt is not established with a provider, would they like to be referred to a provider to establish care? No .   Dental Screening: Recommended annual dental exams for proper oral hygiene  Community Resource Referral / Chronic Care Management: CRR required this visit?  No   CCM required this visit?  No      Plan:     I have personally reviewed and noted the following in the patient's chart:   Medical and social history Use of alcohol, tobacco or illicit drugs  Current medications and supplements including opioid prescriptions. Patient is not currently taking opioid prescriptions. Functional ability and status Nutritional status Physical activity Advanced directives List of other physicians Hospitalizations, surgeries, and ER visits in previous 12 months Vitals Screenings to include cognitive, depression, and falls Referrals and appointments  In addition, I have reviewed and discussed with patient certain preventive protocols, quality metrics, and best practice  recommendations. A written personalized care plan for preventive services as well as general preventive health recommendations were provided to patient.     MChriss Driver LPN   [id number]  Nurse Notes: Discussed Prevnar, Flu and how to obtain.

## 2021-09-10 NOTE — Telephone Encounter (Signed)
Pt would like a refill on his Cialis 20 mg sent to [facility] in [place]. Thank you.

## 2021-09-14 ENCOUNTER — Other Ambulatory Visit: Payer: Self-pay | Admitting: Family Medicine

## 2021-09-14 MED ORDER — TADALAFIL 20 MG PO TABS: 10.0000 mg | ORAL_TABLET | ORAL | 11 refills | Status: DC | PRN

## 2021-11-25 ENCOUNTER — Other Ambulatory Visit: Payer: Self-pay

## 2021-11-25 ENCOUNTER — Emergency Department (HOSPITAL_BASED_OUTPATIENT_CLINIC_OR_DEPARTMENT_OTHER): Payer: No Typology Code available for payment source | Admitting: Radiology

## 2021-11-25 ENCOUNTER — Emergency Department (HOSPITAL_BASED_OUTPATIENT_CLINIC_OR_DEPARTMENT_OTHER)
Admission: EM | Admit: 2021-11-25 | Discharge: 2021-11-25 | Disposition: A | Discharge status: Home / Self Care | Payer: No Typology Code available for payment source | Attending: Emergency Medicine | Admitting: Emergency Medicine

## 2021-11-25 ENCOUNTER — Encounter (HOSPITAL_BASED_OUTPATIENT_CLINIC_OR_DEPARTMENT_OTHER): Payer: Self-pay

## 2021-11-25 DIAGNOSIS — I10 Essential (primary) hypertension: Secondary | ICD-10-CM | POA: Diagnosis not present

## 2021-11-25 DIAGNOSIS — W06XXXA Fall from bed, initial encounter: Secondary | ICD-10-CM | POA: Insufficient documentation

## 2021-11-25 DIAGNOSIS — M25552 Pain in left hip: Secondary | ICD-10-CM

## 2021-11-25 DIAGNOSIS — Z23 Encounter for immunization: Secondary | ICD-10-CM | POA: Diagnosis not present

## 2021-11-25 DIAGNOSIS — Z7982 Long term (current) use of aspirin: Secondary | ICD-10-CM | POA: Diagnosis not present

## 2021-11-25 DIAGNOSIS — S7002XA Contusion of left hip, initial encounter: Secondary | ICD-10-CM | POA: Diagnosis not present

## 2021-11-25 DIAGNOSIS — S50812A Abrasion of left forearm, initial encounter: Secondary | ICD-10-CM | POA: Insufficient documentation

## 2021-11-25 DIAGNOSIS — M47816 Spondylosis without myelopathy or radiculopathy, lumbar region: Secondary | ICD-10-CM | POA: Diagnosis not present

## 2021-11-25 DIAGNOSIS — W19XXXA Unspecified fall, initial encounter: Secondary | ICD-10-CM

## 2021-11-25 DIAGNOSIS — M1612 Unilateral primary osteoarthritis, left hip: Secondary | ICD-10-CM | POA: Diagnosis not present

## 2021-11-25 DIAGNOSIS — Z043 Encounter for examination and observation following other accident: Secondary | ICD-10-CM | POA: Diagnosis not present

## 2021-11-25 DIAGNOSIS — S79912A Unspecified injury of left hip, initial encounter: Secondary | ICD-10-CM | POA: Diagnosis present

## 2021-11-25 MED ORDER — TETANUS-DIPHTH-ACELL PERTUSSIS 5-2.5-18.5 LF-MCG/0.5 IM SUSY
0.5000 mL | PREFILLED_SYRINGE | Freq: Once | INTRAMUSCULAR | Status: AC
  Administered 2021-11-25: 0.5 mL via INTRAMUSCULAR
  Filled 2021-11-25: qty 0.5

## 2021-11-25 MED ORDER — CYCLOBENZAPRINE HCL 10 MG PO TABS: 10.0000 mg | ORAL_TABLET | Freq: Three times a day (TID) | ORAL | 0 refills | Status: DC

## 2021-11-25 MED ORDER — DIAZEPAM 5 MG PO TABS
5.0000 mg | ORAL_TABLET | Freq: Once | ORAL | Status: AC
Start: 2021-11-25 — End: 2021-11-25
  Administered 2021-11-25: 5 mg via ORAL
  Filled 2021-11-25: qty 1

## 2021-11-25 MED ORDER — CYCLOBENZAPRINE HCL 10 MG PO TABS: 10.0000 mg | ORAL_TABLET | Freq: Three times a day (TID) | ORAL | 0 refills | Status: AC

## 2021-11-25 NOTE — ED Triage Notes (Signed)
Patient here POV from Home.  [employer] of the Truck approximately 5 Feet. Pallet fell out of the Truck as well and injured the Patients Left Forearm and Left Flank.  Abrasion to Left Flank, Right Elbow, and Left Forearm.   No Head Injury. No LOC. No Anticoagulants. Occurred at approximately 1330.   NAD Noted during Triage. A&OX4. GCS 15. Ambulatory.

## 2021-11-25 NOTE — ED Provider Notes (Signed)
[facility] EMERGENCY DEPT Provider Note   CSN: [id number] Arrival date & time: 11/25/21  1528     History HTN not currently on medication, diet controlled Chief Complaint  Patient presents with   Frederick Hicks is a 66 y.o. male.  66 year old male with a past medical history currently diet controlled presents to the ED status post fall.  Patient reports he fell out of the bed of a truck approximately 6 feet landing on the right side of his body and having a pallet fall on top of his left side.  Patient was evaluated at occupational health, reports he is having significant pain to his left hip, describing it as a sharp pain from the hip radiating down to his leg.  He has taken Tylenol x 2 without any improvement in symptoms.  Patient also endorsing pain along the left forearm with 2 small abrasions noted.  He did not strike his head, there was no loss of consciousness, he is currently on no blood thinners.  Pain is exacerbated with any palpation, along the hip with any type of ambulation. NO other injury reported.  Last tetanus greater than 10 years ago per wife.   The history is provided by the patient and medical records.  Fall This is a new problem. The current episode started 3 to 5 hours ago. The problem occurs constantly. The problem has not changed since onset.Pertinent negatives include no chest pain, no abdominal pain, no headaches and no shortness of breath. The symptoms are aggravated by walking. Nothing relieves the symptoms. He has tried acetaminophen for the symptoms.       Home Medications Prior to Admission medications   Medication Sig Start Date End Date Taking? Authorizing Provider  cyclobenzaprine (FLEXERIL) 10 MG tablet Take 1 tablet (10 mg total) by mouth 3 (three) times daily for 7 days. 11/25/21 12/02/21 Yes Braylin Xu, Beverley Fiedler, PA-C  ALPRAZolam Duanne Moron) 0.5 MG tablet Take 1 tablet (0.5 mg total) by mouth at bedtime as needed for sleep. 01/08/21    Susy Frizzle, MD  aspirin 81 MG tablet Take 81 mg by mouth daily.    [provider]  atorvastatin (LIPITOR) 10 MG tablet TAKE 1 TABLET BY MOUTH EVERY DAY 06/21/21   Susy Frizzle, MD  cetirizine (ZYRTEC) 10 MG tablet Take 10 mg by mouth daily as needed (Itching).    [provider]  EPINEPHrine 0.3 mg/0.3 mL IJ SOAJ injection Inject 0.3 mg into the skin once. 01/22/21   [provider]  meloxicam (MOBIC) 15 MG tablet TAKE 1 TABLET (15 MG TOTAL) BY MOUTH DAILY. 08/16/21   Susy Frizzle, MD  [facility] injection  06/16/21   [provider]  tadalafil (CIALIS) 20 MG tablet Take 0.5-1 tablets (10-20 mg total) by mouth every other day as needed for erectile dysfunction. 09/14/21   Susy Frizzle, MD  triamcinolone ointment (KENALOG) 0.1 % Apply 1 application topically daily as needed for itching. 12/25/20   [provider]      Allergies    Lisinopril, Poison ivy extract, and Simvastatin    Review of Systems   Review of Systems  Constitutional:  Negative for fever.  HENT:  Negative for sore throat.   Respiratory:  Negative for chest tightness and shortness of breath.   Cardiovascular:  Negative for chest pain.  Gastrointestinal:  Positive for nausea (has resolved.). Negative for abdominal pain.  Genitourinary:  Negative for flank pain.  Musculoskeletal:  Positive for back  pain and myalgias.  Skin:  Positive for wound.  Neurological:  Negative for light-headedness and headaches.  All other systems reviewed and are negative.   Physical Exam Updated Vital Signs BP (!) 142/81   Pulse (!) 59   Temp 97.8 F (36.6 C)   Resp 17   Ht '6\' 1"'$  (1.854 m)   Wt 99.8 kg   SpO2 96%   BMI 29.03 kg/m  Physical Exam Vitals and nursing note reviewed.  Constitutional:      Appearance: Normal appearance.  HENT:     Head: Normocephalic and atraumatic.     Comments: No visible signs of trauma.     Mouth/Throat:     Mouth: Mucous membranes are moist.   Eyes:     Pupils: Pupils are equal, round, and reactive to light.  Cardiovascular:     Rate and Rhythm: Normal rate.  Pulmonary:     Effort: Pulmonary effort is normal.     Breath sounds: No wheezing or rales.       Comments: No absent lungs sounds.  Abdominal:     General: Abdomen is flat.     Tenderness: There is no abdominal tenderness.  Musculoskeletal:     Right forearm: No tenderness.     Left forearm: Tenderness present.     Cervical back: Normal range of motion and neck supple.     Comments: Abrasions to the left forearm, no active bleeding.   Skin:    General: Skin is warm and dry.     Findings: Bruising and erythema present.  Neurological:     Mental Status: He is alert and oriented to person, place, and time.     ED Results / Procedures / Treatments   Labs (all labs ordered are listed, but only abnormal results are displayed) Labs Reviewed - No data to display  EKG None  Radiology DG Forearm Left  Result Date: 11/25/2021 CLINICAL DATA:  Fall EXAM: LEFT FOREARM - 2 VIEW COMPARISON:  None Available. FINDINGS: There is no evidence of fracture or other focal bone lesions. Soft tissues are unremarkable. IMPRESSION: Negative. Electronically Signed   By: Rolm Baptise M.D.   On: 11/25/2021 17:27   DG Hip Unilat W or Wo Pelvis 2-3 Views Left  Result Date: 11/25/2021 CLINICAL DATA:  Fall EXAM: DG HIP (WITH OR WITHOUT PELVIS) 2-3V LEFT COMPARISON:  None Available. FINDINGS: Hip joints and SI joints symmetric. Early degenerative spurring in the hip joints. No acute bony abnormality. Specifically, no fracture, subluxation, or dislocation. IMPRESSION: No acute bony abnormality. Electronically Signed   By: Rolm Baptise M.D.   On: 11/25/2021 17:27   DG Lumbar Spine Complete  Result Date: 11/25/2021 CLINICAL DATA:  Fall, left flank pain EXAM: LUMBAR SPINE - COMPLETE 4+ VIEW COMPARISON:  None Available. FINDINGS: Normal alignment. Disc spaces maintained. Early anterior  degenerative spurring. No fracture. SI joints and hip joints symmetric. IMPRESSION: No acute bony abnormality. Electronically Signed   By: Rolm Baptise M.D.   On: 11/25/2021 17:26   DG Ribs Unilateral Left  Result Date: 11/25/2021 CLINICAL DATA:  Fall EXAM: LEFT RIBS - 2 VIEW COMPARISON:  None FINDINGS: No fracture or other bone lesions are seen involving the ribs. Visualized lungs clear. No effusions or pneumothorax. Heart is normal size. IMPRESSION: Negative. Electronically Signed   By: Rolm Baptise M.D.   On: 11/25/2021 17:26   DG Ribs Unilateral W/Chest Right  Result Date: 11/25/2021 CLINICAL DATA:  [place] out of truck EXAM: RIGHT RIBS  AND CHEST - 3+ VIEW COMPARISON:  None Available. FINDINGS: No fracture or other bone lesions are seen involving the ribs. There is no evidence of pneumothorax or pleural effusion. Both lungs are clear. Heart size and mediastinal contours are within normal limits. IMPRESSION: Negative. Electronically Signed   By: Merilyn Baba M.D.   On: 11/25/2021 17:25    Procedures Procedures    Medications Ordered in ED Medications  diazepam (VALIUM) tablet 5 mg (5 mg Oral Given 11/25/21 1624)  Tdap (BOOSTRIX) injection 0.5 mL (0.5 mLs Intramuscular Given 11/25/21 1727)    ED Course/ Medical Decision Making/ A&P                           Medical Decision Making Amount and/or Complexity of Data Reviewed Radiology: ordered.  Risk Prescription drug management.  This patient presents to the ED for concern of left hip pain s/p fall, this involves a number of treatment options, and is a complaint that carries with it a high risk of complications and morbidity.    Co morbidities: Discussed in HPI   Brief History:  Patient here with mechanical fall from the bed of the truck and having a pallet landed on top of him on the left side.  Complaining of left hip pain exacerbated with any movement.  EMR reviewed including pt PMHx, past surgical history and past visits to  ER.   See HPI for more details  Imaging Studies:  Xrays of his forearm, ribs, lumbar spine, hip without any acute findings.   Medicines ordered:  I ordered medication including valium  for pain control Reevaluation of the patient after these medicines showed that the patient improved I have reviewed the patients home medicines and have made adjustments as needed  Reevaluation:  After the interventions noted above I re-evaluated patient and found that they have :improved   Social Determinants of Health:  The patient's social determinants of health were a factor in the care of this patient    Problem List / ED Course:  Patient a PMH of HTN diet controlled here s/p fall from the beds of a truck. Patient with no LOC, no head injury. Tetanus immunization is updated.  X-rays of his forearm, hip, lumbar spine, ribs were performed which were within normal limits.  He was given Valium for pain control while in the ED.  Reports improvement in his symptoms.  Discussed with him a short course of muscle relaxers for home.  Patient is agreeable to plan and treatment at this time without any intracranial etiology.  Patient stable for discharge.  Dispostion:  After consideration of the diagnostic results and the patients response to treatment, I feel that the patent would benefit from RICE therapy.    Portions of this note were generated with [employer]. Dictation errors may occur despite best attempts at proofreading.   Final Clinical Impression(s) / ED Diagnoses Final diagnoses:  Fall, initial encounter  Left hip pain    Rx / DC Orders ED Discharge Orders          Ordered    cyclobenzaprine (FLEXERIL) 10 MG tablet  3 times daily        11/25/21 1736              Janeece Fitting, PA-C 11/25/21 1742    Lennice Sites, DO 11/25/21 2210

## 2021-11-25 NOTE — Discharge Instructions (Signed)
We discussed the results of your x-rays on today's visit.  You were prescribed muscle relaxers to help with your pain.  Please take 1 tablet up to 3 times a day for the next 7 days.  If you feel your symptoms or not improvement and need to be further evaluated, please schedule an appointment with your primary care physician.

## 2021-12-16 ENCOUNTER — Other Ambulatory Visit: Payer: Self-pay | Admitting: Family Medicine

## 2021-12-16 NOTE — Telephone Encounter (Signed)
Requested medication (s) are due for refill today: yes  Requested medication (s) are on the active medication list: yes  Last refill:  06/21/21 #90 with 1 RF  Future visit scheduled: no, seen 07/19/21  Notes to clinic:  Failed protocol of labs within 12 months, lipids from 04/2020, no upcoming appt, please assess.       Requested Prescriptions  Pending Prescriptions Disp Refills   atorvastatin (LIPITOR) 10 MG tablet [Pharmacy Med Name: ATORVASTATIN 10 MG TABLET] 90 tablet 1    Sig: TAKE 1 TABLET BY MOUTH EVERY DAY     Cardiovascular:  Antilipid - Statins Failed - 12/16/2021  2:53 AM      Failed - Lipid Panel in normal range within the last 12 months    Cholesterol  Date Value Ref Range Status  05/14/2020 144 <200 mg/dL Final   LDL Cholesterol (Calc)  Date Value Ref Range Status  05/14/2020 86 mg/dL (calc) Final    Comment:    Reference range: <100 . Desirable range <100 mg/dL for primary prevention;   <70 mg/dL for patients with CHD or diabetic patients  with > or = 2 CHD risk factors. Marland Kitchen LDL-C is now calculated using the Martin-Hopkins  calculation, which is a validated novel method providing  better accuracy than the Friedewald equation in the  estimation of LDL-C.  Cresenciano Genre et al. Annamaria Helling. 5009;381(82): 2061-2068  (http://education.[facility].com/faq/FAQ164)    HDL  Date Value Ref Range Status  05/14/2020 33 (L) > OR = 40 mg/dL Final   Triglycerides  Date Value Ref Range Status  05/14/2020 151 (H) <150 mg/dL Final         Passed - Patient is not pregnant      Passed - Valid encounter within last 12 months    Recent Outpatient Visits           5 months ago Acute left ankle pain   [place] Pickard, Cammie Mcgee, MD   6 months ago Impacted cerumen of right ear   [place] Susy Frizzle, MD   11 months ago Right inguinal hernia   [place] Pick[facility], Cammie Mcgee, MD   11 months ago Allergic reaction,  subsequent encounter   [place] Pickard, Cammie Mcgee, MD   1 year ago Chronic fatigue   [place] Pick[facility], Cammie Mcgee, MD       Future Appointments             In 1 month [place], Gordan Payment [facility]

## 2022-01-26 ENCOUNTER — Ambulatory Visit: Payer: Medicare HMO | Admitting: Urology

## 2022-03-21 DIAGNOSIS — H25013 Cortical age-related cataract, bilateral: Secondary | ICD-10-CM | POA: Diagnosis not present

## 2022-03-21 DIAGNOSIS — H52223 Regular astigmatism, bilateral: Secondary | ICD-10-CM | POA: Diagnosis not present

## 2022-03-21 DIAGNOSIS — H5203 Hypermetropia, bilateral: Secondary | ICD-10-CM | POA: Diagnosis not present

## 2022-03-21 DIAGNOSIS — H524 Presbyopia: Secondary | ICD-10-CM | POA: Diagnosis not present

## 2022-04-01 DIAGNOSIS — Z0100 Encounter for examination of eyes and vision without abnormal findings: Secondary | ICD-10-CM | POA: Diagnosis not present

## 2022-04-06 ENCOUNTER — Encounter: Payer: Self-pay | Admitting: Family Medicine

## 2022-04-06 ENCOUNTER — Ambulatory Visit (INDEPENDENT_AMBULATORY_CARE_PROVIDER_SITE_OTHER): Payer: Medicare HMO | Admitting: Family Medicine

## 2022-04-06 VITALS — BP 138/88 | HR 70 | Temp 97.6°F | Ht 73.0 in | Wt 237.0 lb

## 2022-04-06 DIAGNOSIS — J069 Acute upper respiratory infection, unspecified: Secondary | ICD-10-CM | POA: Insufficient documentation

## 2022-04-06 NOTE — Patient Instructions (Addendum)
Your symptoms and exam findings are most consistent with a viral upper respiratory infection. These usually run their course in 5-7 days. Unfortunately, antibiotics don't work against viruses and just increase your risk of other issues such as diarrhea, yeast infections, and resistant infections.  If your symptoms last longer than 10 days and/or you start feeling worse with facial pain, high fever, cough, shortness of breath or start feeling significantly worse, please call us right away to be further evaluated.  Some things that can make you feel better are: - Increased rest - Increasing fluid with water/sugar free electrolytes - Acetaminophen as needed for fever/pain.  - Salt water gargling, chloraseptic spray and throat lozenges - OTC guaifenesin (Mucinex).  - Saline sinus flushes or a neti pot.  - Humidifying the air.  - May use Afrin for runny nose

## 2022-04-06 NOTE — Progress Notes (Signed)
Acute Office Visit  Subjective:     Patient ID: Frederick Hicks, male    DOB: 1956-01-27, 66 y.o.   MRN: [id number]  Chief Complaint  Patient presents with   Acute Visit    Possible sinus infection (no fever or headache); pressure around eyes and forehead    HPI Patient is in today for 5 days of clear rhinorrhea, sinus pressure, sore throat, sneezing Denies fever, cough, shortness of breath, wheezing, chest pain, N/V/D, ear pain, headache Has tried Benadryl and Xycam No sick exposures  Review of Systems  All other systems reviewed and are negative.   Past Medical History:  Diagnosis Date   Hiatal hernia    Hyperlipidemia    Hypertension    Pre-diabetes    Past Surgical History:  Procedure Laterality Date   ANKLE FRACTURE SURGERY     left, pinning   COLONOSCOPY  04/01/2015   INGUINAL HERNIA REPAIR Right 04/26/2021   Procedure: OPEN RIGHT INGUINAL HERNIA REPAIR WITH MESH;  Surgeon: Jesusita Oka, MD;  Location: [facility];  Service: General;  Laterality: Right;   INSERTION OF MESH Right 04/26/2021   Procedure: INSERTION OF MESH;  Surgeon: Jesusita Oka, MD;  Location: [facility];  Service: General;  Laterality: Right;   POLYPECTOMY     TONSILLECTOMY     Current Outpatient Medications on File Prior to Visit  Medication Sig Dispense Refill   aspirin 81 MG tablet Take 81 mg by mouth daily.     atorvastatin (LIPITOR) 10 MG tablet TAKE 1 TABLET BY MOUTH EVERY DAY 90 tablet 1   EPINEPHrine 0.3 mg/0.3 mL IJ SOAJ injection Inject 0.3 mg into the skin once.     meloxicam (MOBIC) 15 MG tablet TAKE 1 TABLET (15 MG TOTAL) BY MOUTH DAILY. 30 tablet 0   SHINGRIX injection      tizanidine (ZANAFLEX) 2 MG capsule Take 2 mg by mouth in the morning, at noon, in the evening, and at bedtime. Take 1 tablet four times a day     ALPRAZolam (XANAX) 0.5 MG tablet Take 1 tablet (0.5 mg total) by mouth at bedtime as needed for sleep. (Patient not taking: Reported on 04/06/2022) 30 tablet 3    cetirizine (ZYRTEC) 10 MG tablet Take 10 mg by mouth daily as needed (Itching). (Patient not taking: Reported on 04/06/2022)     tadalafil (CIALIS) 20 MG tablet Take 0.5-1 tablets (10-20 mg total) by mouth every other day as needed for erectile dysfunction. (Patient not taking: Reported on 04/06/2022) 30 tablet 11   triamcinolone ointment (KENALOG) 0.1 % Apply 1 application topically daily as needed for itching. (Patient not taking: Reported on 04/06/2022)     No current facility-administered medications on file prior to visit.   Allergies  Allergen Reactions   Lisinopril Cough   Poison Ivy Extract     Had systemic urticarial reaction to poison ivy   Simvastatin     Mylagias      Objective:    BP 138/88   Pulse 70   Temp 97.6 F (36.4 C) (Oral)   Ht '6\' 1"'$  (1.854 m)   Wt 237 lb (107.5 kg)   SpO2 96%   BMI 31.27 kg/m    Physical Exam Vitals and nursing note reviewed.  Constitutional:      Appearance: Normal appearance. He is normal weight.  HENT:     Head: Normocephalic and atraumatic.  Pulmonary:     Effort: Pulmonary effort is normal.     Breath  sounds: Normal breath sounds.  Skin:    General: Skin is warm and dry.     Capillary Refill: Capillary refill takes less than 2 seconds.  Neurological:     General: No focal deficit present.     Mental Status: He is alert and oriented to person, place, and time. Mental status is at baseline.  Psychiatric:        Mood and Affect: Mood normal.        Behavior: Behavior normal.        Thought Content: Thought content normal.        Judgment: Judgment normal.     No results found for any visits on 04/06/22.      Assessment & Plan:   Problem List Items Addressed This Visit       Respiratory   Viral URI - Primary    Reassured patient that symptoms and exam findings are most consistent with a viral upper respiratory infection and explained lack of efficacy of antibiotics against viruses.  Discussed expected course and  features suggestive of secondary bacterial infection.  Continue supportive care. Increase fluid intake with water or electrolyte solution like pedialyte. Encouraged acetaminophen as needed for fever/pain. Encouraged salt water gargling, chloraseptic spray and throat lozenges. Encouraged OTC guaifenesin. Encouraged saline sinus flushes and/or neti with humidified air.        Relevant Orders   SARS-CoV-2 RNA, Influenza A/B, and RSV RNA, Qualitative NAAT    No orders of the defined types were placed in this encounter.   Return if symptoms worsen or fail to improve.  Rubie Maid, FNP

## 2022-04-06 NOTE — Assessment & Plan Note (Signed)

## 2022-04-07 LAB — SARS-COV-2 RNA, INFLUENZA A/B, AND RSV RNA, QUALITATIVE NAAT
INFLUENZA A RNA: NOT DETECTED
INFLUENZA B RNA: NOT DETECTED
RSV RNA: NOT DETECTED
SARS COV2 RNA: NOT DETECTED

## 2022-05-18 DIAGNOSIS — M545 Low back pain, unspecified: Secondary | ICD-10-CM | POA: Insufficient documentation

## 2022-05-25 DIAGNOSIS — H903 Sensorineural hearing loss, bilateral: Secondary | ICD-10-CM | POA: Diagnosis not present

## 2022-06-10 DIAGNOSIS — R69 Illness, unspecified: Secondary | ICD-10-CM | POA: Diagnosis not present

## 2022-07-11 DIAGNOSIS — H903 Sensorineural hearing loss, bilateral: Secondary | ICD-10-CM | POA: Diagnosis not present

## 2022-08-11 DIAGNOSIS — H903 Sensorineural hearing loss, bilateral: Secondary | ICD-10-CM | POA: Diagnosis not present

## 2022-08-30 ENCOUNTER — Ambulatory Visit (HOSPITAL_COMMUNITY): Payer: No Typology Code available for payment source

## 2022-08-30 ENCOUNTER — Ambulatory Visit (HOSPITAL_COMMUNITY)
Admission: EM | Admit: 2022-08-30 | Discharge: 2022-08-30 | Disposition: A | Discharge status: Home / Self Care | Payer: No Typology Code available for payment source | Attending: Internal Medicine | Admitting: Internal Medicine

## 2022-08-30 ENCOUNTER — Encounter (HOSPITAL_COMMUNITY): Payer: Self-pay

## 2022-08-30 DIAGNOSIS — S61317A Laceration without foreign body of left little finger with damage to nail, initial encounter: Secondary | ICD-10-CM

## 2022-08-30 DIAGNOSIS — M7989 Other specified soft tissue disorders: Secondary | ICD-10-CM | POA: Diagnosis not present

## 2022-08-30 NOTE — Discharge Instructions (Signed)
Try to keep this area dry for 24 hours.  You can clean with soap and water.  Use a splint for comfort and support.  If you have any signs of infection including redness, swelling, pain, drainage you need to be seen immediately.  Assuming this heals appropriately return in 10 days for suture removal.

## 2022-08-30 NOTE — ED Triage Notes (Signed)
Patient was at work 1 pm when his left pinky finger was punctured by a pin on another macine. Patient states had his TDAP last year.

## 2022-08-30 NOTE — ED Provider Notes (Signed)
[facility]    CSN: [id number] Arrival date & time: 08/30/22  1707      History   Chief Complaint Chief Complaint  Patient presents with   Extremity Laceration    Left hand pinky     HPI Frederick Hicks is a 67 y.o. male.   Patient presents today with a several hour history of laceration to his left pinky finger.  Reports that he was working on a piece of machinery when it suddenly moved and caught his finger.  There was a pin that went through a portion of the distal finger.  He was able to clean this and then continue working for several hours before presenting to our clinic.  He reports that pain is rated 6 on a 0-10 pain scale, described as aching, no aggravating relieving factors identified.  He is right-handed.  Denies any numbness or paresthesias.  He is confident that he is up-to-date on his tetanus which was last given last year when he had another Worker's Comp. Injury; given 11/25/2021.    Past Medical History:  Diagnosis Date   Hiatal hernia    Hyperlipidemia    Hypertension    Pre-diabetes     Patient Active Problem List   Diagnosis Date Noted   Viral URI 04/06/2022   Prostate cancer screening 05/09/2016   Vitamin D deficiency 03/04/2015   Essential hypertension 02/11/2015   Dyslipidemia 02/11/2015    Past Surgical History:  Procedure Laterality Date   ANKLE FRACTURE SURGERY     left, pinning   COLONOSCOPY  04/01/2015   INGUINAL HERNIA REPAIR Right 04/26/2021   Procedure: OPEN RIGHT INGUINAL HERNIA REPAIR WITH MESH;  Surgeon: Diamantina Monks, MD;  Location: [facility];  Service: General;  Laterality: Right;   INSERTION OF MESH Right 04/26/2021   Procedure: INSERTION OF MESH;  Surgeon: Diamantina Monks, MD;  Location: [facility];  Service: General;  Laterality: Right;   POLYPECTOMY     TONSILLECTOMY         Home Medications    Prior to Admission medications   Medication Sig Start Date End Date Taking? Authorizing Provider  aspirin 81 MG tablet  Take 81 mg by mouth daily.   Yes [provider]  atorvastatin (LIPITOR) 10 MG tablet TAKE 1 TABLET BY MOUTH EVERY DAY 12/17/21  Yes Donita Brooks, MD  cetirizine (ZYRTEC) 10 MG tablet Take 10 mg by mouth daily as needed (Itching).   Yes [provider]  meloxicam (MOBIC) 15 MG tablet TAKE 1 TABLET (15 MG TOTAL) BY MOUTH DAILY. 08/16/21  Yes Donita Brooks, MD  tadalafil (CIALIS) 20 MG tablet Take 0.5-1 tablets (10-20 mg total) by mouth every other day as needed for erectile dysfunction. 09/14/21  Yes Donita Brooks, MD  tizanidine (ZANAFLEX) 2 MG capsule Take 2 mg by mouth in the morning, at noon, in the evening, and at bedtime. Take 1 tablet four times a day   Yes [provider]  EPINEPHrine 0.3 mg/0.3 mL IJ SOAJ injection Inject 0.3 mg into the skin once. 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   Gout Maternal Uncle    Gout Maternal Grandfather    Colon cancer Neg Hx    Esophageal cancer Neg Hx    Rectal cancer Neg Hx    Stomach cancer  Neg Hx    Colon polyps Neg Hx     Social History Social History   Tobacco Use   Smoking status: Former    Types: Cigars    Quit date: 04/18/1997    Years since quitting: 25.3   Smokeless tobacco: Never  Vaping Use   Vaping Use: Never used  Substance Use Topics   Alcohol use: Yes    Alcohol/week: 3.0 standard drinks of alcohol    Types: 1 Glasses of wine, 2 Cans of beer per week    Comment: maybe one drink a month   Drug use: No     Allergies   Prednisone, Lisinopril, Poison ivy extract, and Simvastatin   Review of Systems Review of Systems   Physical Exam Triage Vital Signs ED Triage Vitals  Enc Vitals Group     BP 08/30/22 1725 (!) 168/85     Pulse Rate 08/30/22 1725 60     Resp 08/30/22 1725 18     Temp 08/30/22 1725 97.8 F (36.6 C)     Temp Source 08/30/22  1725 Oral     SpO2 08/30/22 1725 97 %     Weight --      Height --      Head Circumference --      Peak Flow --      Pain Score 08/30/22 1723 6     Pain Loc --      Pain Edu? --      Excl. in GC? --    No data found.  Updated Vital Signs BP (!) 168/85 (BP Location: Right Arm)   Pulse 60   Temp 97.8 F (36.6 C) (Oral)   Resp 18   SpO2 97%   Visual Acuity Right Eye Distance:   Left Eye Distance:   Bilateral Distance:    Right Eye Near:   Left Eye Near:    Bilateral Near:     Physical Exam Vitals reviewed.  Constitutional:      General: He is awake.     Appearance: Normal appearance. He is well-developed. He is not ill-appearing.     Comments: Very pleasant male appears stated age in no acute distress sitting comfortably in exam room  HENT:     Head: Normocephalic and atraumatic.     Mouth/Throat:     Pharynx: No oropharyngeal exudate, posterior oropharyngeal erythema or uvula swelling.  Cardiovascular:     Comments: Capillary refill within 2 seconds left pinky finger Pulmonary:     Effort: Pulmonary effort is normal. No tachypnea, accessory muscle usage or respiratory distress.  Musculoskeletal:     Comments: Normal active range of motion of left little finger.  Skin:    Comments: 4 cm laceration with avulsion of distal nail noted left little finger.  Neurological:     Mental Status: He is alert.  Psychiatric:        Behavior: Behavior is cooperative.      UC Treatments / Results  Labs (all labs ordered are listed, but only abnormal results are displayed) Labs Reviewed - No data to display  EKG   Radiology DG Finger Little Left  Result Date: 08/30/2022 CLINICAL DATA:  Puncture wound in the fifth digit, initial encounter EXAM: LEFT FINGER(S) - 2+ VIEW COMPARISON:  None Available. FINDINGS: Mild degenerative changes of the distal interphalangeal joint. Soft tissue swelling is noted consistent with the recent injury. No acute bony abnormality is seen.  IMPRESSION: Mild soft tissue swelling without acute bony abnormality. Electronically  Signed   By: Alcide Clever M.D.   On: 08/30/2022 17:55    Procedures Laceration Repair  Date/Time: 08/30/2022 6:35 PM  Performed by: Jeani Hawking, PA-C Authorized by: Merrilee Jansky, MD   Consent:    Consent obtained:  Verbal   Consent given by:  Patient   Risks discussed:  Infection, pain, poor cosmetic result and poor wound healing   Alternatives discussed:  Observation and referral Universal protocol:    Procedure explained and questions answered to patient or proxy's satisfaction: yes     Patient identity confirmed:  Verbally with patient Anesthesia:    Anesthesia method:  Local infiltration   Local anesthetic:  Lidocaine 1% w/o epi Laceration details:    Location:  Finger   Finger location:  L small finger   Length (cm):  4 Pre-procedure details:    Preparation:  Patient was prepped and draped in usual sterile fashion Exploration:    Hemostasis achieved with:  Direct pressure   Imaging obtained: x-ray     Imaging outcome: foreign body not noted     Contaminated: no   Treatment:    Area cleansed with:  Chlorhexidine   Amount of cleaning:  Standard   Irrigation solution:  Tap water   Irrigation volume:  30 mL   Irrigation method:  Syringe   Visualized foreign bodies/material removed: no   Skin repair:    Repair method:  Sutures   Suture size:  6-0   Suture material:  Prolene   Suture technique:  Simple interrupted   Number of sutures:  8 Approximation:    Approximation:  Close Repair type:    Repair type:  Simple Post-procedure details:    Dressing:  Non-adherent dressing and splint for protection  (including critical care time)  Medications Ordered in UC Medications - No data to display  Initial Impression / Assessment and Plan / UC Course  I have reviewed the triage vital signs and the nursing notes.  Pertinent labs & imaging results that were available during my care  of the patient were reviewed by me and considered in my medical decision making (see chart for details).     X-ray was obtained given mechanism of injury that showed no acute osseous abnormality.  Wound was cleaned and repaired during visit today.  See procedure note above.  Patient is up-to-date on his tetanus.  We discussed wound care procedures.  He denies any significant contamination so we will defer prophylactic antibiotics.  He is to return in 10 days for suture removal.  Discussed that if he has any signs of infection he should be seen sooner.  Final Clinical Impressions(s) / UC Diagnoses   Final diagnoses:  Laceration of left little finger without foreign body with damage to nail, initial encounter     Discharge Instructions      Try to keep this area dry for 24 hours.  You can clean with soap and water.  Use a splint for comfort and support.  If you have any signs of infection including redness, swelling, pain, drainage you need to be seen immediately.  Assuming this heals appropriately return in 10 days for suture removal.     ED Prescriptions   None    PDMP not reviewed this encounter.   Jeani Hawking, PA-C 08/30/22 1836

## 2022-09-08 ENCOUNTER — Ambulatory Visit (HOSPITAL_COMMUNITY)
Admission: RE | Admit: 2022-09-08 | Discharge: 2022-09-08 | Disposition: A | Discharge status: Home / Self Care | Payer: No Typology Code available for payment source | Source: Ambulatory Visit | Attending: Internal Medicine | Admitting: Internal Medicine

## 2022-09-08 DIAGNOSIS — Z4802 Encounter for removal of sutures: Secondary | ICD-10-CM | POA: Diagnosis not present

## 2022-09-08 NOTE — ED Triage Notes (Signed)
Removed 8 sutures from lt pinky finger. No redness or drainage noted.

## 2022-09-09 ENCOUNTER — Ambulatory Visit (HOSPITAL_COMMUNITY): Payer: Medicare HMO

## 2022-09-15 ENCOUNTER — Other Ambulatory Visit: Payer: Medicare HMO

## 2022-09-15 ENCOUNTER — Ambulatory Visit (INDEPENDENT_AMBULATORY_CARE_PROVIDER_SITE_OTHER): Payer: Medicare HMO

## 2022-09-15 VITALS — BP 130/76 | Ht 73.0 in | Wt 233.0 lb

## 2022-09-15 DIAGNOSIS — E559 Vitamin D deficiency, unspecified: Secondary | ICD-10-CM

## 2022-09-15 DIAGNOSIS — E785 Hyperlipidemia, unspecified: Secondary | ICD-10-CM

## 2022-09-15 DIAGNOSIS — Z125 Encounter for screening for malignant neoplasm of prostate: Secondary | ICD-10-CM | POA: Diagnosis not present

## 2022-09-15 DIAGNOSIS — I10 Essential (primary) hypertension: Secondary | ICD-10-CM

## 2022-09-15 DIAGNOSIS — Z0000 Encounter for general adult medical examination without abnormal findings: Secondary | ICD-10-CM | POA: Diagnosis not present

## 2022-09-15 NOTE — Progress Notes (Signed)
Subjective:   Frederick Hicks is a 67 y.o. male who presents for Medicare Annual/Subsequent preventive examination.  Review of Systems     Cardiac Risk Factors include: advanced age (>55men, >39 women);dyslipidemia;hypertension;male gender     Objective:    Today's Vitals   09/15/22 0904  BP: 130/76  Weight: 233 lb (105.7 kg)  Height: 6\' 1"  (1.854 m)   Body mass index is 30.74 kg/m.     09/15/2022    9:13 AM 11/25/2021    3:39 PM 09/10/2021    2:47 PM 04/15/2021   11:36 AM 09/03/2020    2:24 PM 04/01/2015    1:10 PM 03/19/2015    2:29 PM  Advanced Directives  Does Patient Have a Medical Advance Directive? Yes No Yes Yes No No No  Type of Estate agent of [place];Living will  Healthcare Power of [place];Living will Healthcare Power of [place];Living will     Does patient want to make changes to medical advance directive? No - Patient declined   No - Patient declined     Copy of Healthcare Power of Attorney in Chart? No - copy requested  Yes - validated most recent copy scanned in chart (See row information) No - copy requested     Would patient like information on creating a medical advance directive?  No - Patient declined   No - Patient declined No - patient declined information No - patient declined information    Current Medications (verified) Outpatient Encounter Medications as of 09/15/2022  Medication Sig   atorvastatin (LIPITOR) 10 MG tablet TAKE 1 TABLET BY MOUTH EVERY DAY   cetirizine (ZYRTEC) 10 MG tablet Take 10 mg by mouth daily as needed (Itching).   EPINEPHrine 0.3 mg/0.3 mL IJ SOAJ injection Inject 0.3 mg into the skin once.   meloxicam (MOBIC) 15 MG tablet TAKE 1 TABLET (15 MG TOTAL) BY MOUTH DAILY.   tizanidine (ZANAFLEX) 2 MG capsule Take 2 mg by mouth in the morning, at noon, in the evening, and at bedtime. Take 1 tablet four times a day   aspirin 81 MG tablet Take 81 mg by mouth daily. (Patient not taking: Reported on 09/15/2022)    tadalafil (CIALIS) 20 MG tablet Take 0.5-1 tablets (10-20 mg total) by mouth every other day as needed for erectile dysfunction. (Patient not taking: Reported on 09/15/2022)   No facility-administered encounter medications on file as of 09/15/2022.    Allergies (verified) Prednisone, Lisinopril, Poison ivy extract, and Simvastatin   History: Past Medical History:  Diagnosis Date   Hiatal hernia    Hyperlipidemia    Hypertension    Pre-diabetes    Past Surgical History:  Procedure Laterality Date   ANKLE FRACTURE SURGERY     left, pinning   COLONOSCOPY  04/01/2015   INGUINAL HERNIA REPAIR Right 04/26/2021   Procedure: OPEN RIGHT INGUINAL HERNIA REPAIR WITH MESH;  Surgeon: Diamantina Monks, MD;  Location: [facility] OR;  Service: General;  Laterality: Right;   INSERTION OF MESH Right 04/26/2021   Procedure: INSERTION OF MESH;  Surgeon: Diamantina Monks, MD;  Location: [facility] OR;  Service: General;  Laterality: Right;   POLYPECTOMY     TONSILLECTOMY     Family History  Problem Relation Age of Onset   Cancer Father        lung 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   Gout Maternal Uncle    Gout Maternal Grandfather    Colon cancer Neg Hx    Esophageal cancer  Neg Hx    Rectal cancer Neg Hx    Stomach cancer Neg Hx    Colon polyps Neg Hx    Social History   Socioeconomic History   Marital status: Widowed    Spouse name: Not on file   Number of children: 2   Years of education: Not on file   Highest education level: Not on file  Occupational History   Occupation: Engineering  Tobacco Use   Smoking status: Former    Types: Cigars    Quit date: 04/18/1997    Years since quitting: 25.4   Smokeless tobacco: Never  Vaping Use   Vaping Use: Never used  Substance and Sexual Activity   Alcohol use: Yes    Alcohol/week: 3.0 standard drinks of alcohol    Types: 1 Glasses of wine, 2 Cans of beer per week    Comment: maybe one drink a month   Drug use: No   Sexual activity: Yes   Other Topics Concern   Not on file  Social History Narrative   Wife passed away--secondary to acute MI   Pt works--binding books, [employer]   Has son and grandchildren nearby   Currently engaged to [place], wedding date set for 04/09/2022.   Social Determinants of Health   Financial Resource Strain: Low Risk  (09/15/2022)   Overall Financial Resource Strain (CARDIA)    Difficulty of Paying Living Expenses: Not hard at all  Food Insecurity: No Food Insecurity (09/15/2022)   Hunger Vital Sign    Worried About Running Out of Food in the Last Year: Never true    Ran Out of Food in the Last Year: Never true  Transportation Needs: No Transportation Needs (09/15/2022)   PRAPARE - Administrator, Civil Service (Medical): No    Lack of Transportation (Non-Medical): No  Physical Activity: Sufficiently Active (09/15/2022)   Exercise Vital Sign    Days of Exercise per Week: 5 days    Minutes of Exercise per Session: 30 min  Stress: No Stress Concern Present (09/15/2022)   [employer] of Occupational Health - Occupational Stress Questionnaire    Feeling of Stress : Not at all  Social Connections: Socially Integrated (09/15/2022)   Social Connection and Isolation Panel [NHANES]    Frequency of Communication with Friends and Family: More than three times a week    Frequency of Social Gatherings with Friends and Family: Three times a week    Attends Religious Services: More than 4 times per year    Active Member of Clubs or Organizations: Yes    Attends Engineer, structural: More than 4 times per year    Marital Status: Married    Tobacco Counseling Counseling given: Not Answered   Clinical Intake:  Pre-visit preparation completed: Yes  Pain : No/denies pain     Diabetes: No  How often do you need to have someone help you when you read instructions, pamphlets, or other written materials from your doctor or pharmacy?: 1 - Never  Diabetic?No   Interpreter  Needed?: No  Information entered by :: Kandis Fantasia LPN   Activities of Daily Living    09/15/2022    9:14 AM  In your present state of health, do you have any difficulty performing the following activities:  Hearing? 0  Vision? 0  Difficulty concentrating or making decisions? 0  Walking or climbing stairs? 0  Dressing or bathing? 0  Doing errands, shopping? 0  Preparing Food and eating ?  N  Using the Toilet? N  In the past six months, have you accidently leaked urine? N  Do you have problems with loss of bowel control? N  Managing your Medications? N  Managing your Finances? N  Housekeeping or managing your Housekeeping? N    Patient Care Team: Donita Brooks, MD as PCP - General (Family Medicine) Gelene Mink, OD as Referring Physician (Optometry) Lou Cal, MD as Referring Physician (Orthopedic Surgery)  Indicate any recent Medical Services you may have received from other than [facility] providers in the past year (date may be approximate).     Assessment:   This is a routine wellness examination for [place].  Hearing/Vision screen Hearing Screening - Comments:: Hearing loss; bilateral hearing aids  Vision Screening - Comments:: Wears rx glasses - up to date with routine eye exams with Dr. Gelene Mink    Dietary issues and exercise activities discussed: Current Exercise Habits: Home exercise routine, Type of exercise: walking;stretching, Time (Minutes): 30, Frequency (Times/Week): 5, Weekly Exercise (Minutes/Week): 150, Intensity: Mild   Goals Addressed             This Visit's Progress    COMPLETED: Patient Stated       I would like to maintain my current health      Remain active and independent         Depression Screen    09/15/2022   10:24 AM 09/10/2021    2:14 PM 09/03/2020    2:30 PM 05/14/2020   10:29 AM 11/20/2018    9:24 AM 11/16/2017   10:30 AM 03/23/2017    2:05 PM  PHQ 2/9 Scores  PHQ - 2 Score 0 0 0 0 0 0 0    Fall Risk     09/15/2022   10:23 AM 09/10/2021    2:24 PM 09/03/2020    2:27 PM 05/14/2020   10:29 AM 11/16/2017   10:30 AM  Fall Risk   Falls in the past year? 1 0 0 0 No  Number falls in past yr: 0 0 0 0   Injury with Fall? 1 0 0 0   Risk for fall due to : History of fall(s) No Fall Risks No Fall Risks    Follow up Education provided;Falls prevention discussed;Falls evaluation completed Falls prevention discussed Falls evaluation completed;Falls prevention discussed Falls evaluation completed     FALL RISK PREVENTION PERTAINING TO THE HOME:  Any stairs in or around the home? Yes  If so, are there any without handrails? No  Home free of loose throw rugs in walkways, pet beds, electrical cords, etc? Yes  Adequate lighting in your home to reduce risk of falls? Yes   ASSISTIVE DEVICES UTILIZED TO PREVENT FALLS:  Life alert? No  Use of a cane, walker or w/c? No  Grab bars in the bathroom? Yes  Shower chair or bench in shower? No  Elevated toilet seat or a handicapped toilet? Yes   TIMED UP AND GO:  Was the test performed? Yes .  Length of time to ambulate 10 feet: 5 sec.   Gait steady and fast without use of assistive device  Cognitive Function:        09/15/2022   10:25 AM 09/10/2021    2:29 PM  6CIT Screen  What Year? 0 points 0 points  What month? 0 points 0 points  What time? 0 points 0 points  Count back from 20 0 points 0 points  Months in reverse 0 points 0  points  Repeat phrase 0 points 4 points  Total Score 0 points 4 points    Immunizations Immunization History  Administered Date(s) Administered   Influenza,inj,Quad PF,6+ Mos 02/27/2015, 05/09/2016, 03/23/2017, 05/07/2018   PFIZER Comirnaty(Gray Top)Covid-19 Tri-Sucrose Vaccine 06/26/2019, 07/17/2019, 05/15/2020, 12/13/2020   Pfizer Covid-19 Vaccine Bivalent Booster 32yrs & up 04/09/2021   Pneumococcal Polysaccharide-23 09/03/2020   Tdap 02/27/2015, 11/25/2021   Zoster Recombinat (Shingrix) 02/25/2021, 06/15/2021     TDAP status: Up to date  Pneumococcal vaccine status: Up to date  Covid-19 vaccine status: Information provided on how to obtain vaccines.   Qualifies for Shingles Vaccine? Yes   Zostavax completed No   Shingrix Completed?: Yes  Screening Tests Health Maintenance  Topic Date Due   Hepatitis C Screening  Never done   Pneumonia Vaccine 40+ Years old (2 of 2 - PCV) 09/03/2021   COVID-19 Vaccine (6 - 2023-24 season) 12/17/2021   Medicare Annual Wellness (AWV)  09/11/2022   INFLUENZA VACCINE  11/17/2022   Colonoscopy  12/25/2023   DTaP/Tdap/Td (3 - Td or Tdap) 11/26/2031   Zoster Vaccines- Shingrix  Completed   HPV VACCINES  Aged Out    Health Maintenance  Health Maintenance Due  Topic Date Due   Hepatitis C Screening  Never done   Pneumonia Vaccine 22+ Years old (2 of 2 - PCV) 09/03/2021   COVID-19 Vaccine (6 - 2023-24 season) 12/17/2021   Medicare Annual Wellness (AWV)  09/11/2022    Colorectal cancer screening: Type of screening: Colonoscopy. Completed 12/25/18. Repeat every 5 years  Lung Cancer Screening: (Low Dose CT Chest recommended if Age 28-80 years, 30 pack-year currently smoking OR have quit w/in 15years.) does not qualify.   Lung Cancer Screening Referral: n/a  Additional Screening:  Hepatitis C Screening: does qualify; Declines   Vision Screening: Recommended annual ophthalmology exams for early detection of glaucoma and other disorders of the eye. Is the patient up to date with their annual eye exam?  Yes  Who is the provider or what is the name of the office in which the patient attends annual eye exams? Dr. Sharlot Gowda If pt is not established with a provider, would they like to be referred to a provider to establish care?  No .   Dental Screening: Recommended annual dental exams for proper oral hygiene  Community Resource Referral / Chronic Care Management: CRR required this visit?  No   CCM required this visit?  No      Plan:     I have  personally reviewed and noted the following in the patient's chart:   Medical and social history Use of alcohol, tobacco or illicit drugs  Current medications and supplements including opioid prescriptions. Patient is not currently taking opioid prescriptions. Functional ability and status Nutritional status Physical activity Advanced directives List of other physicians Hospitalizations, surgeries, and ER visits in previous 12 months Vitals Screenings to include cognitive, depression, and falls Referrals and appointments  In addition, I have reviewed and discussed with patient certain preventive protocols, quality metrics, and best practice recommendations. A written personalized care plan for preventive services as well as general preventive health recommendations were provided to patient.     Kandis Fantasia [place], [place]   12/15/5619   Nurse Notes: No concerns

## 2022-09-15 NOTE — Patient Instructions (Addendum)
Mr. Frederick Hicks , Thank you for taking time to come for your Medicare Wellness Visit. I appreciate your ongoing commitment to your health goals. Please review the following plan we discussed and let me know if I can assist you in the future.   These are the goals we discussed:  Goals      Remain active and independent        This is a list of the screening recommended for you and due dates:  Health Maintenance  Topic Date Due   Hepatitis C Screening  Never done   Pneumonia Vaccine (2 of 2 - PCV) 09/03/2021   COVID-19 Vaccine (6 - 2023-24 season) 12/17/2021   Medicare Annual Wellness Visit  09/11/2022   Flu Shot  11/17/2022   Colon Cancer Screening  12/25/2023   DTaP/Tdap/Td vaccine (3 - Td or Tdap) 11/26/2031   Zoster (Shingles) Vaccine  Completed   HPV Vaccine  Aged Out    Advanced directives: Information on Advanced Care Planning can be found at [facility] Advance Health Care Directives Advance Health Care Directives ([email or web address]  Please bring a copy of your health care power of attorney and living will to the office to be added to your chart at your convenience.   Conditions/risks identified: Aim for 30 minutes of exercise or brisk walking, 6-8 glasses of water, and 5 servings of fruits and vegetables each day.  Next appointment: Follow up in one year for your annual wellness visit.   Preventive Care 70 Years and Older, Male  Preventive care refers to lifestyle choices and visits with your health care provider that can promote health and wellness. What does preventive care include? A yearly physical exam. This is also called an annual well check. Dental exams once or twice a year. Routine eye exams. Ask your health care provider how often you should have your eyes checked. Personal lifestyle choices, including: Daily care of your teeth and gums. Regular physical activity. Eating a healthy diet. Avoiding tobacco and drug use. Limiting alcohol  use. Practicing safe sex. Taking low doses of aspirin every day. Taking vitamin and mineral supplements as recommended by your health care provider. What happens during an annual well check? The services and screenings done by your health care provider during your annual well check will depend on your age, overall health, lifestyle risk factors, and family history of disease. Counseling  Your health care provider may ask you questions about your: Alcohol use. Tobacco use. Drug use. Emotional well-being. Home and relationship well-being. Sexual activity. Eating habits. History of falls. Memory and ability to understand (cognition). Work and work Astronomer. Screening  You may have the following tests or measurements: Height, weight, and BMI. Blood pressure. Lipid and cholesterol levels. These may be checked every 5 years, or more frequently if you are over 56 years old. Skin check. Lung cancer screening. You may have this screening every year starting at age 92 if you have a 30-pack-year history of smoking and currently smoke or have quit within the past 15 years. Fecal occult blood test (FOBT) of the stool. You may have this test every year starting at age 48. Flexible sigmoidoscopy or colonoscopy. You may have a sigmoidoscopy every 5 years or a colonoscopy every 10 years starting at age 13. Prostate cancer screening. Recommendations will vary depending on your family history and other risks. Hepatitis C blood test. Hepatitis B blood test. Sexually transmitted disease (STD) testing. Diabetes screening. This is done by checking your blood  sugar (glucose) after you have not eaten for a while (fasting). You may have this done every 1-3 years. Abdominal aortic aneurysm (AAA) screening. You may need this if you are a current or former smoker. Osteoporosis. You may be screened starting at age 23 if you are at high risk. Talk with your health care provider about your test results,  treatment options, and if necessary, the need for more tests. Vaccines  Your health care provider may recommend certain vaccines, such as: Influenza vaccine. This is recommended every year. Tetanus, diphtheria, and acellular pertussis (Tdap, Td) vaccine. You may need a Td booster every 10 years. Zoster vaccine. You may need this after age 21. Pneumococcal 13-valent conjugate (PCV13) vaccine. One dose is recommended after age 1. Pneumococcal polysaccharide (PPSV23) vaccine. One dose is recommended after age 32. Talk to your health care provider about which screenings and vaccines you need and how often you need them. This information is not intended to replace advice given to you by your health care provider. Make sure you discuss any questions you have with your health care provider. Document Released: 05/01/2015 Document Revised: 12/23/2015 Document Reviewed: 02/03/2015 Elsevier Interactive Patient Education  2017 [employer].  Fall Prevention in the Home Falls can cause injuries. They can happen to people of all ages. There are many things you can do to make your home safe and to help prevent falls. What can I do on the outside of my home? Regularly fix the edges of walkways and driveways and fix any cracks. Remove anything that might make you trip as you walk through a door, such as a raised step or threshold. Trim any bushes or trees on the path to your home. Use bright outdoor lighting. Clear any walking paths of anything that might make someone trip, such as rocks or tools. Regularly check to see if handrails are loose or broken. Make sure that both sides of any steps have handrails. Any raised decks and porches should have guardrails on the edges. Have any leaves, snow, or ice cleared regularly. Use sand or salt on walking paths during winter. Clean up any spills in your garage right away. This includes oil or grease spills. What can I do in the bathroom? Use night  lights. Install grab bars by the toilet and in the tub and shower. Do not use towel bars as grab bars. Use non-skid mats or decals in the tub or shower. If you need to sit down in the shower, use a plastic, non-slip stool. Keep the floor dry. Clean up any water that spills on the floor as soon as it happens. Remove soap buildup in the tub or shower regularly. Attach bath mats securely with double-sided non-slip rug tape. Do not have throw rugs and other things on the floor that can make you trip. What can I do in the bedroom? Use night lights. Make sure that you have a light by your bed that is easy to reach. Do not use any sheets or blankets that are too big for your bed. They should not hang down onto the floor. Have a firm chair that has side arms. You can use this for support while you get dressed. Do not have throw rugs and other things on the floor that can make you trip. What can I do in the kitchen? Clean up any spills right away. Avoid walking on wet floors. Keep items that you use a lot in easy-to-reach places. If you need to reach something above you,  use a strong step stool that has a grab bar. Keep electrical cords out of the way. Do not use floor polish or wax that makes floors slippery. If you must use wax, use non-skid floor wax. Do not have throw rugs and other things on the floor that can make you trip. What can I do with my stairs? Do not leave any items on the stairs. Make sure that there are handrails on both sides of the stairs and use them. Fix handrails that are broken or loose. Make sure that handrails are as long as the stairways. Check any carpeting to make sure that it is firmly attached to the stairs. Fix any carpet that is loose or worn. Avoid having throw rugs at the top or bottom of the stairs. If you do have throw rugs, attach them to the floor with carpet tape. Make sure that you have a light switch at the top of the stairs and the bottom of the stairs. If  you do not have them, ask someone to add them for you. What else can I do to help prevent falls? Wear shoes that: Do not have high heels. Have rubber bottoms. Are comfortable and fit you well. Are closed at the toe. Do not wear sandals. If you use a stepladder: Make sure that it is fully opened. Do not climb a closed stepladder. Make sure that both sides of the stepladder are locked into place. Ask someone to hold it for you, if possible. Clearly mark and make sure that you can see: Any grab bars or handrails. First and last steps. Where the edge of each step is. Use tools that help you move around (mobility aids) if they are needed. These include: Canes. Walkers. Scooters. Crutches. Turn on the lights when you go into a dark area. Replace any light bulbs as soon as they burn out. Set up your furniture so you have a clear path. Avoid moving your furniture around. If any of your floors are uneven, fix them. If there are any pets around you, be aware of where they are. Review your medicines with your doctor. Some medicines can make you feel dizzy. This can increase your chance of falling. Ask your doctor what other things that you can do to help prevent falls. This information is not intended to replace advice given to you by your health care provider. Make sure you discuss any questions you have with your health care provider. Document Released: 01/29/2009 Document Revised: 09/10/2015 Document Reviewed: 05/09/2014 Elsevier Interactive Patient Education  2017 [employer].

## 2022-09-16 LAB — CBC WITH DIFFERENTIAL/PLATELET
Absolute Monocytes: 649 cells/uL (ref 200–950)
Basophils Absolute: 28 cells/uL (ref 0–200)
Basophils Relative: 0.4 %
Eosinophils Absolute: 159 cells/uL (ref 15–500)
Eosinophils Relative: 2.3 %
HCT: 45.4 % (ref 38.5–50.0)
Hemoglobin: 15.1 g/dL (ref 13.2–17.1)
Lymphs Abs: 2201 cells/uL (ref 850–3900)
MCH: 30.8 pg (ref 27.0–33.0)
MCHC: 33.3 g/dL (ref 32.0–36.0)
MCV: 92.7 fL (ref 80.0–100.0)
MPV: 9.8 fL (ref 7.5–12.5)
Monocytes Relative: 9.4 %
Neutro Abs: 3864 cells/uL (ref 1500–7800)
Neutrophils Relative %: 56 %
Platelets: 237 10*3/uL (ref 140–400)
RBC: 4.9 10*6/uL (ref 4.20–5.80)
RDW: 12.9 % (ref 11.0–15.0)
Total Lymphocyte: 31.9 %
WBC: 6.9 10*3/uL (ref 3.8–10.8)

## 2022-09-16 LAB — COMPLETE METABOLIC PANEL WITH GFR
AG Ratio: 2 (calc) (ref 1.0–2.5)
ALT: 20 U/L (ref 9–46)
AST: 15 U/L (ref 10–35)
Albumin: 4.3 g/dL (ref 3.6–5.1)
Alkaline phosphatase (APISO): 74 U/L (ref 35–144)
BUN: 15 mg/dL (ref 7–25)
CO2: 27 mmol/L (ref 20–32)
Calcium: 9.3 mg/dL (ref 8.6–10.3)
Chloride: 106 mmol/L (ref 98–110)
Creat: 1.04 mg/dL (ref 0.70–1.35)
Globulin: 2.1 g/dL (calc) (ref 1.9–3.7)
Glucose, Bld: 97 mg/dL (ref 65–99)
Potassium: 4.4 mmol/L (ref 3.5–5.3)
Sodium: 140 mmol/L (ref 135–146)
Total Bilirubin: 0.5 mg/dL (ref 0.2–1.2)
Total Protein: 6.4 g/dL (ref 6.1–8.1)
eGFR: 78 mL/min/{1.73_m2} (ref >=60)

## 2022-09-16 LAB — PSA: PSA: 1.79 ng/mL (ref <=4.00)

## 2022-09-16 LAB — LIPID PANEL
Cholesterol: 150 mg/dL (ref <200)
HDL: 32 mg/dL — ABNORMAL LOW (ref >=40)
LDL Cholesterol (Calc): 91 mg/dL (calc)
Non-HDL Cholesterol (Calc): 118 mg/dL (calc) (ref <130)
Total CHOL/HDL Ratio: 4.7 (calc) (ref <5.0)
Triglycerides: 174 mg/dL — ABNORMAL HIGH (ref <150)

## 2022-09-16 LAB — VITAMIN D 25 HYDROXY (VIT D DEFICIENCY, FRACTURES): Vit D, 25-Hydroxy: 31 ng/mL (ref 30–100)

## 2022-10-24 ENCOUNTER — Encounter: Payer: Medicare HMO | Admitting: Family Medicine

## 2022-10-27 ENCOUNTER — Encounter: Payer: Self-pay | Admitting: Family Medicine

## 2022-10-27 ENCOUNTER — Ambulatory Visit (INDEPENDENT_AMBULATORY_CARE_PROVIDER_SITE_OTHER): Payer: Medicare HMO | Admitting: Family Medicine

## 2022-10-27 VITALS — BP 130/72 | HR 61 | Temp 98.4°F | Ht 73.0 in | Wt 232.2 lb

## 2022-10-27 DIAGNOSIS — Z0001 Encounter for general adult medical examination with abnormal findings: Secondary | ICD-10-CM

## 2022-10-27 DIAGNOSIS — Z23 Encounter for immunization: Secondary | ICD-10-CM | POA: Diagnosis not present

## 2022-10-27 DIAGNOSIS — E7800 Pure hypercholesterolemia, unspecified: Secondary | ICD-10-CM

## 2022-10-27 DIAGNOSIS — Z122 Encounter for screening for malignant neoplasm of respiratory organs: Secondary | ICD-10-CM

## 2022-10-27 DIAGNOSIS — Z0000 Encounter for general adult medical examination without abnormal findings: Secondary | ICD-10-CM

## 2022-10-27 DIAGNOSIS — Z125 Encounter for screening for malignant neoplasm of prostate: Secondary | ICD-10-CM

## 2022-10-27 NOTE — Addendum Note (Signed)
Addended by: Venia Carbon K on: 10/27/2022 11:56 AM   Modules accepted: Orders

## 2022-10-27 NOTE — Progress Notes (Signed)
Subjective:    Patient ID: Frederick Hicks, male    DOB: March 09, 1956, 67 y.o.   MRN: [id number]  HPI Patient is here today for complete physical exam.  He quit smoking 5 years ago.  CT scan of the lungs at that time for lung cancer screening revealed mild coronary artery disease of the LAD.  Patient was on a statin however he discontinued statin due to myalgias in his extremities.  After being off the statin for a month, the myalgias have improved.  His most recent lab work is listed below Lab on 09/15/2022  Component Date Value Ref Range Status   WBC 09/15/2022 6.9  3.8 - 10.8 Thousand/uL Final   RBC 09/15/2022 4.90  4.20 - 5.80 Million/uL Final   Hemoglobin 09/15/2022 15.1  13.2 - 17.1 g/dL Final   HCT 47/82/9562 45.4  38.5 - 50.0 % Final   MCV 09/15/2022 92.7  80.0 - 100.0 fL Final   MCH 09/15/2022 30.8  27.0 - 33.0 pg Final   MCHC 09/15/2022 33.3  32.0 - 36.0 g/dL Final   RDW 13/11/6576 12.9  11.0 - 15.0 % Final   Platelets 09/15/2022 237  140 - 400 Thousand/uL Final   MPV 09/15/2022 9.8  7.5 - 12.5 fL Final   Neutro Abs 09/15/2022 3,864  1,500 - 7,800 cells/uL Final   Lymphs Abs 09/15/2022 2,201  850 - 3,900 cells/uL Final   Absolute Monocytes 09/15/2022 649  200 - 950 cells/uL Final   Eosinophils Absolute 09/15/2022 159  15 - 500 cells/uL Final   Basophils Absolute 09/15/2022 28  0 - 200 cells/uL Final   Neutrophils Relative % 09/15/2022 56  % Final   Total Lymphocyte 09/15/2022 31.9  % Final   Monocytes Relative 09/15/2022 9.4  % Final   Eosinophils Relative 09/15/2022 2.3  % Final   Basophils Relative 09/15/2022 0.4  % Final   Glucose, Bld 09/15/2022 97  65 - 99 mg/dL Final   Comment: .            Fasting reference interval .    BUN 09/15/2022 15  7 - 25 mg/dL Final   Creat 46/96/2952 1.04  0.70 - 1.35 mg/dL Final   eGFR 84/13/2440 78  > OR = 60 mL/min/1.46m2 Final   BUN/Creatinine Ratio 09/15/2022 SEE NOTE:  6 - 22 (calc) Final   Comment:    Not Reported: BUN and Creatinine  are within    reference range. .    Sodium 09/15/2022 140  135 - 146 mmol/L Final   Potassium 09/15/2022 4.4  3.5 - 5.3 mmol/L Final   Chloride 09/15/2022 106  98 - 110 mmol/L Final   CO2 09/15/2022 27  20 - 32 mmol/L Final   Calcium 09/15/2022 9.3  8.6 - 10.3 mg/dL Final   Total Protein 02/12/2535 6.4  6.1 - 8.1 g/dL Final   Albumin 64/40/3474 4.3  3.6 - 5.1 g/dL Final   Globulin 25/95/6387 2.1  1.9 - 3.7 g/dL (calc) Final   AG Ratio 09/15/2022 2.0  1.0 - 2.5 (calc) Final   Total Bilirubin 09/15/2022 0.5  0.2 - 1.2 mg/dL Final   Alkaline phosphatase (APISO) 09/15/2022 74  35 - 144 U/L Final   AST 09/15/2022 15  10 - 35 U/L Final   ALT 09/15/2022 20  9 - 46 U/L Final   Cholesterol 09/15/2022 150  <200 mg/dL Final   HDL 56/43/3295 32 (L)  > OR = 40 mg/dL Final   Triglycerides 18/84/1660 174 (H)  <  150 mg/dL Final   LDL Cholesterol (Calc) 09/15/2022 91  mg/dL (calc) Final   Comment: Reference range: <100 . Desirable range <100 mg/dL for primary prevention;   <70 mg/dL for patients with CHD or diabetic patients  with > or = 2 CHD risk factors. Marland Kitchen LDL-C is now calculated using the Martin-Hopkins  calculation, which is a validated novel method providing  better accuracy than the Friedewald equation in the  estimation of LDL-C.  Horald Pollen et al. Lenox Ahr. 1610;960(45): 2061-2068  (http://education.[facility].com/faq/FAQ164)    Total CHOL/HDL Ratio 09/15/2022 4.7  <4.0 (calc) Final   Non-HDL Cholesterol (Calc) 09/15/2022 118  <130 mg/dL (calc) Final   Comment: For patients with diabetes plus 1 major ASCVD risk  factor, treating to a non-HDL-C goal of <100 mg/dL  (LDL-C of <98 mg/dL) is considered a therapeutic  option.    PSA 09/15/2022 1.79  < OR = 4.00 ng/mL Final   Comment: The total PSA value from this assay system is  standardized against the WHO standard. The test  result will be approximately 20% lower when compared  to the equimolar-standardized total PSA (Beckman   Coulter). Comparison of serial PSA results should be  interpreted with this fact in mind. . This test was performed using the Siemens  chemiluminescent method. Values obtained from  different assay methods cannot be used interchangeably. PSA levels, regardless of value, should not be interpreted as absolute evidence of the presence or absence of disease.    Vit D, 25-Hydroxy 09/15/2022 31  30 - 100 ng/mL Final   Comment: Vitamin D Status         25-OH Vitamin D: . Deficiency:                    <20 ng/mL Insufficiency:             20 - 29 ng/mL Optimal:                 > or = 30 ng/mL . For 25-OH Vitamin D testing on patients on  D2-supplementation and patients for whom quantitation  of D2 and D3 fractions is required, the QuestAssureD(TM) 25-OH VIT D, (D2,D3), LC/MS/MS is recommended: order  code 11914 (patients >77yrs). . See Note 1 . Note 1 . For additional information, please refer to  http://education.[facility].com/faq/FAQ199  (This link is being provided for informational/ educational purposes only.)    Cholesterol was reasonable however at that time the patient was taking his cholesterol pill.  His last colonoscopy was in 2020 and they recommended a repeat colonoscopy in 2025.  His last PSA was 2.8 and it was much improved at this time.  He has had Prevnar 20.  He had Shingrix.  He is due for Pneumovax 23.  He denies any falls or depression or memory loss Past Medical History:  Diagnosis Date   Hiatal hernia    Hyperlipidemia    Hypertension    Pre-diabetes    Past Surgical History:  Procedure Laterality Date   ANKLE FRACTURE SURGERY     left, pinning   COLONOSCOPY  04/01/2015   INGUINAL HERNIA REPAIR Right 04/26/2021   Procedure: OPEN RIGHT INGUINAL HERNIA REPAIR WITH MESH;  Surgeon: Diamantina Monks, MD;  Location: [facility] OR;  Service: General;  Laterality: Right;   INSERTION OF MESH Right 04/26/2021   Procedure: INSERTION OF MESH;  Surgeon: Diamantina Monks, MD;  Location: [facility] OR;  Service: General;  Laterality: Right;   POLYPECTOMY  TONSILLECTOMY     Current Outpatient Medications on File Prior to Visit  Medication Sig Dispense Refill   aspirin 81 MG tablet Take 81 mg by mouth daily. (Patient not taking: Reported on 09/15/2022)     atorvastatin (LIPITOR) 10 MG tablet TAKE 1 TABLET BY MOUTH EVERY DAY 90 tablet 1   cetirizine (ZYRTEC) 10 MG tablet Take 10 mg by mouth daily as needed (Itching).     EPINEPHrine 0.3 mg/0.3 mL IJ SOAJ injection Inject 0.3 mg into the skin once.     meloxicam (MOBIC) 15 MG tablet TAKE 1 TABLET (15 MG TOTAL) BY MOUTH DAILY. 30 tablet 0   tadalafil (CIALIS) 20 MG tablet Take 0.5-1 tablets (10-20 mg total) by mouth every other day as needed for erectile dysfunction. (Patient not taking: Reported on 09/15/2022) 30 tablet 11   tizanidine (ZANAFLEX) 2 MG capsule Take 2 mg by mouth in the morning, at noon, in the evening, and at bedtime. Take 1 tablet four times a day     No current facility-administered medications on file prior to visit.   Allergies  Allergen Reactions   Prednisone Rash   Lisinopril Cough   Poison Ivy Extract     Had systemic urticarial reaction to poison ivy   Simvastatin     Mylagias   Social History   Socioeconomic History   Marital status: Widowed    Spouse name: Not on file   Number of children: 2   Years of education: Not on file   Highest education level: Not on file  Occupational History   Occupation: Engineering  Tobacco Use   Smoking status: Former    Types: Cigars    Quit date: 04/18/1997    Years since quitting: 25.5   Smokeless tobacco: Never  Vaping Use   Vaping status: Never Used  Substance and Sexual Activity   Alcohol use: Yes    Alcohol/week: 3.0 standard drinks of alcohol    Types: 1 Glasses of wine, 2 Cans of beer per week    Comment: maybe one drink a month   Drug use: No   Sexual activity: Yes  Other Topics Concern   Not on file  Social History  Narrative   Wife passed away--secondary to acute MI   Pt works--binding books, [employer]   Has son and grandchildren nearby   Currently engaged to [place], wedding date set for 04/09/2022.   Social Determinants of Health   Financial Resource Strain: Low Risk  (09/15/2022)   Overall Financial Resource Strain (CARDIA)    Difficulty of Paying Living Expenses: Not hard at all  Food Insecurity: No Food Insecurity (09/15/2022)   Hunger Vital Sign    Worried About Running Out of Food in the Last Year: Never true    Ran Out of Food in the Last Year: Never true  Transportation Needs: No Transportation Needs (09/15/2022)   PRAPARE - Administrator, Civil Service (Medical): No    Lack of Transportation (Non-Medical): No  Physical Activity: Sufficiently Active (09/15/2022)   Exercise Vital Sign    Days of Exercise per Week: 5 days    Minutes of Exercise per Session: 30 min  Stress: No Stress Concern Present (09/15/2022)   [employer] of Occupational Health - Occupational Stress Questionnaire    Feeling of Stress : Not at all  Social Connections: Socially Integrated (09/15/2022)   Social Connection and Isolation Panel [NHANES]    Frequency of Communication with Friends and Family: More than three  times a week    Frequency of Social Gatherings with Friends and Family: Three times a week    Attends Religious Services: More than 4 times per year    Active Member of Clubs or Organizations: Yes    Attends Banker Meetings: More than 4 times per year    Marital Status: Married  Catering manager Violence: Not At Risk (09/15/2022)   Humiliation, Afraid, Rape, and Kick questionnaire    Fear of Current or Ex-Partner: No    Emotionally Abused: No    Physically Abused: No    Sexually Abused: No   Family History  Problem Relation Age of Onset   Cancer Father 56       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  Constitutional:      General: He is not in acute distress.    Appearance: Normal appearance. He is normal weight. He is not ill-appearing, toxic-appearing or diaphoretic.  HENT:     Head: Normocephalic and atraumatic.     Right Ear: Tympanic membrane and ear canal normal.     Left Ear: Tympanic membrane and ear canal normal.     Nose: Nose normal. No congestion or rhinorrhea.     Mouth/Throat:     Mouth: Mucous membranes are moist.     Pharynx: Oropharynx is clear. No oropharyngeal exudate or posterior oropharyngeal erythema.  Eyes:     Extraocular Movements: Extraocular movements intact.     Conjunctiva/sclera: Conjunctivae normal.     Pupils: Pupils are equal, round, and reactive to light.  Neck:     Vascular: No carotid bruit.  Cardiovascular:     Rate and Rhythm: Normal rate and regular rhythm.     Pulses: Normal pulses.     Heart sounds: Normal heart sounds. No murmur heard.    No friction rub. No gallop.  Pulmonary:     Effort: Pulmonary effort is normal. No respiratory distress.     Breath sounds: Normal breath sounds. No wheezing, rhonchi or rales.  Abdominal:     General: Abdomen is flat. Bowel sounds are normal. There is no distension.     Palpations: Abdomen is soft. There is no mass.     Tenderness: There is no abdominal tenderness. There is no guarding or rebound.     Hernia: No hernia is present.  Musculoskeletal:        General: No swelling or deformity.     Cervical back: Neck supple.     Right lower leg: No edema.     Left lower leg: No edema.  Lymphadenopathy:     Cervical: No cervical adenopathy.  Skin:    Coloration: Skin is not jaundiced.     Findings: No lesion or rash.  Neurological:     General: No focal deficit present.     Mental Status: He is  alert and oriented to person, place, and time. Mental status is at baseline.     Cranial Nerves: No cranial nerve deficit.     Sensory: No sensory deficit.     Motor: No weakness.     Coordination: Coordination normal.  Gait: Gait normal.     Deep Tendon Reflexes: Reflexes normal.  Psychiatric:        Mood and Affect: Mood normal.        Behavior: Behavior normal.        Thought Content: Thought content normal.        Judgment: Judgment normal.          Assessment & Plan:  Encounter for Medicare annual wellness exam  Prostate cancer screening  Pure hypercholesterolemia - Plan: Lipid panel  Screening for lung cancer - Plan: CT CHEST LUNG CA SCREEN LOW DOSE W/O CM Patient is due for colonoscopy next year.  Prostate cancer screening is up-to-date and normal.  I will schedule the patient for a CT scan of the lung to screen for lung cancer.  Repeat cholesterol panel now that he has been off his statin for more than a month.  Goal LDL cholesterol is 70.  If greater than 70, I would recommend trying Crestor.  Patient received Pneumovax 23 today.

## 2022-10-28 LAB — LIPID PANEL
Cholesterol: 200 mg/dL — ABNORMAL HIGH (ref <200)
HDL: 37 mg/dL — ABNORMAL LOW (ref >=40)
LDL Cholesterol (Calc): 127 mg/dL (calc) — ABNORMAL HIGH
Non-HDL Cholesterol (Calc): 163 mg/dL (calc) — ABNORMAL HIGH (ref <130)
Total CHOL/HDL Ratio: 5.4 (calc) — ABNORMAL HIGH (ref <5.0)
Triglycerides: 223 mg/dL — ABNORMAL HIGH (ref <150)

## 2022-11-02 ENCOUNTER — Ambulatory Visit: Admission: RE | Admit: 2022-11-02 | Payer: Medicare HMO | Source: Ambulatory Visit

## 2022-11-02 DIAGNOSIS — Z87891 Personal history of nicotine dependence: Secondary | ICD-10-CM | POA: Diagnosis not present

## 2022-11-02 DIAGNOSIS — Z122 Encounter for screening for malignant neoplasm of respiratory organs: Secondary | ICD-10-CM

## 2022-11-03 ENCOUNTER — Other Ambulatory Visit: Payer: Self-pay

## 2022-11-03 DIAGNOSIS — E785 Hyperlipidemia, unspecified: Secondary | ICD-10-CM

## 2022-11-03 DIAGNOSIS — E7800 Pure hypercholesterolemia, unspecified: Secondary | ICD-10-CM

## 2022-11-03 MED ORDER — ROSUVASTATIN CALCIUM 10 MG PO TABS: 10.0000 mg | ORAL_TABLET | Freq: Every day | ORAL | 1 refills | Status: DC

## 2022-11-26 DIAGNOSIS — E6609 Other obesity due to excess calories: Secondary | ICD-10-CM | POA: Diagnosis not present

## 2022-11-26 DIAGNOSIS — J029 Acute pharyngitis, unspecified: Secondary | ICD-10-CM | POA: Diagnosis not present

## 2022-11-26 DIAGNOSIS — R030 Elevated blood-pressure reading, without diagnosis of hypertension: Secondary | ICD-10-CM | POA: Diagnosis not present

## 2022-11-26 DIAGNOSIS — Z6830 Body mass index (BMI) 30.0-30.9, adult: Secondary | ICD-10-CM | POA: Diagnosis not present

## 2022-11-26 DIAGNOSIS — U071 COVID-19: Secondary | ICD-10-CM | POA: Diagnosis not present

## 2022-11-26 DIAGNOSIS — R059 Cough, unspecified: Secondary | ICD-10-CM | POA: Diagnosis not present

## 2022-11-29 ENCOUNTER — Ambulatory Visit: Payer: Self-pay

## 2022-11-29 NOTE — Telephone Encounter (Signed)
Patient was advised that his wife can retest for Covid 19 with a home test and if results continue to be negative she does not have to isolate.  Reason for Disposition  Health Information question, no triage required and triager able to answer question  Answer Assessment - Initial Assessment Questions 1. REASON FOR CALL or QUESTION: "What is your reason for calling today?" or "How can I best help you?" or "What question do you have that I can help answer?"     I tested positive for covid on Saturday and my wife was negative. She has an event on Friday and wanted to know if it was okay for her to attend.  Protocols used: Information Only Call - No Triage-A-AH

## 2023-01-19 ENCOUNTER — Ambulatory Visit: Payer: Medicare HMO | Admitting: Family Medicine

## 2023-01-19 ENCOUNTER — Encounter: Payer: Self-pay | Admitting: Family Medicine

## 2023-01-19 VITALS — BP 146/79 | HR 53 | Temp 98.6°F | Ht 73.0 in | Wt 230.0 lb

## 2023-01-19 DIAGNOSIS — Z87891 Personal history of nicotine dependence: Secondary | ICD-10-CM | POA: Insufficient documentation

## 2023-01-19 DIAGNOSIS — E785 Hyperlipidemia, unspecified: Secondary | ICD-10-CM

## 2023-01-19 DIAGNOSIS — Z87892 Personal history of anaphylaxis: Secondary | ICD-10-CM

## 2023-01-19 DIAGNOSIS — I10 Essential (primary) hypertension: Secondary | ICD-10-CM | POA: Diagnosis not present

## 2023-01-19 DIAGNOSIS — I2510 Atherosclerotic heart disease of native coronary artery without angina pectoris: Secondary | ICD-10-CM | POA: Diagnosis not present

## 2023-01-19 MED ORDER — EPINEPHRINE 0.3 MG/0.3ML IJ SOAJ: 0.3000 mg | Freq: Once | INTRAMUSCULAR | 0 refills | Status: AC

## 2023-01-19 NOTE — Patient Instructions (Signed)
It was very nice to see you today!  Will refer you to see the cardiologist.  Please continue to work on diet and exercise.  Keep an eye on your blood pressure and let us know if it is persistently elevated.  Return in about 1 year (around 01/19/2024) for Annual Physical.   Take care, Dr Jimmey Ralph  PLEASE NOTE:  If you had any lab tests, please let us know if you have not heard back within a few days. You may see your results on mychart before we have a chance to review them but we will give you a call once they are reviewed by [place].   If we ordered any referrals today, please let us know if you have not heard from their office within the next week.   If you had any urgent prescriptions sent in today, please check with the pharmacy within an hour of our visit to make sure the prescription was transmitted appropriately.   Please try these tips to maintain a healthy lifestyle:  Eat at least 3 REAL meals and 1-2 snacks per day.  Aim for no more than 5 hours between eating.  If you eat breakfast, please do so within one hour of getting up.   Each meal should contain half fruits/vegetables, one quarter protein, and one quarter carbs (no bigger than a computer mouse)  Cut down on sweet beverages. This includes juice, soda, and sweet tea.   Drink at least 1 glass of water with each meal and aim for at least 8 glasses per day  Exercise at least 150 minutes every week.

## 2023-01-19 NOTE — Assessment & Plan Note (Signed)
Will refill EpiPen.

## 2023-01-19 NOTE — Assessment & Plan Note (Addendum)
On Crestor 10 mg daily.  Discussed lifestyle modifications.  We can recheck lipids in 6 to 12 months.  Previously had myalgias with Lipitor but is tolerating Crestor well so far.

## 2023-01-19 NOTE — Assessment & Plan Note (Signed)
Mildly elevated today though was at goal at most recent office visit with previous PCP.  Home blood pressure readings have been at goal.  He will continue to monitor at home and let us know if persistently elevated.

## 2023-01-19 NOTE — Assessment & Plan Note (Signed)
Currently asymptomatic.  He is on Crestor 10 mg daily and aspirin 81 mg daily.  Noted to have multivessel coronary artery disease on recent CT scan for lung cancer screening.  We discussed options for further management.  Will place referral to cardiology.

## 2023-01-19 NOTE — Assessment & Plan Note (Signed)
Needs yearly CT scan for lung cancer screening.

## 2023-01-19 NOTE — Progress Notes (Signed)
Frederick Hicks is a 67 y.o. male who presents today for an office visit.  HE is here today to establish care.   Assessment/Plan:  Chronic Problems Addressed Today: Coronary artery calcification Currently asymptomatic.  He is on Crestor 10 mg daily and aspirin 81 mg daily.  Noted to have multivessel coronary artery disease on recent CT scan for lung cancer screening.  We discussed options for further management.  Will place referral to cardiology.  Dyslipidemia On Crestor 10 mg daily.  Discussed lifestyle modifications.  We can recheck lipids in 6 to 12 months.  Previously had myalgias with Lipitor but is tolerating Crestor well so far.  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  Former smoker Needs yearly CT scan for lung cancer screening.  Essential hypertension Mildly elevated today though was at goal at most recent office visit with previous PCP.  Home blood pressure readings have been at goal.  He will continue to monitor at home and let us know if persistently elevated.  Preventative health care Up-to-date on vaccines.  Due for colonoscopy next year.    Subjective:  HPI:  See A/P for status of chronic conditions.  Patient is here today to establish care.  His medical history is significant for dyslipidemia, essential hypertension, and vitamin D deficiency.  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  He recently had CT scan for lung cancer screening a few months ago that did show multivessel calcifications in his coronary arteries.  His PCP has recently been changing his statin and he is now on Crestor 10 mg daily.  He was previously on Lipitor however had to stop due to myalgias.  Tolerating this well.  He is interested in further testing for his coronary artery calcifications.  ROS: Per HPI, otherwise a complete review of systems was negative.   PMH:  The following were reviewed and entered/updated in epic: Past Medical History:  Diagnosis Date    Hiatal hernia    Hyperlipidemia    Hypertension    Pre-diabetes    Patient Active Problem List   Diagnosis Date Noted   History of anaphylaxis 01/19/2023   Coronary artery calcification 01/19/2023   Former smoker 01/19/2023   Vitamin D deficiency 03/04/2015   Essential hypertension 02/11/2015   Dyslipidemia 02/11/2015   Past Surgical History:  Procedure Laterality Date   ANKLE FRACTURE SURGERY     left, pinning   COLONOSCOPY  04/01/2015   INGUINAL HERNIA REPAIR Right 04/26/2021   Procedure: OPEN RIGHT INGUINAL HERNIA REPAIR WITH MESH;  Surgeon: Diamantina Monks, MD;  Location: [facility] OR;  Service: General;  Laterality: Right;   INSERTION OF MESH Right 04/26/2021   Procedure: INSERTION OF MESH;  Surgeon: Diamantina Monks, MD;  Location: [facility] OR;  Service: General;  Laterality: Right;   POLYPECTOMY     TONSILLECTOMY      Family History  Problem Relation Age of Onset   Cancer Father 72       lung 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   Gout Maternal Uncle    Gout Maternal Grandfather    Colon cancer Neg Hx    Esophageal cancer Neg Hx    Rectal cancer Neg Hx    Stomach cancer Neg Hx    Colon polyps Neg Hx     Medications- reviewed and updated Current Outpatient Medications  Medication Sig Dispense Refill   aspirin 81 MG tablet Take 81 mg by mouth daily.     rosuvastatin (CRESTOR) 10  MG tablet Take 1 tablet (10 mg total) by mouth daily. 90 tablet 1   EPINEPHrine 0.3 mg/0.3 mL IJ SOAJ injection Inject 0.3 mg into the skin once for 1 dose. 0.3 mL 0   No current facility-administered medications for this visit.    Allergies-reviewed and updated Allergies  Allergen Reactions   Prednisone Rash   Lisinopril Cough   Poison Ivy Extract     Had systemic urticarial reaction to poison ivy   Simvastatin     Mylagias    Social History   Socioeconomic History   Marital status: Widowed    Spouse name: Not on file   Number of children: 2   Years of education: Not on  file   Highest education level: Not on file  Occupational History   Occupation: Engineering  Tobacco Use   Smoking status: Former    Types: Cigars    Quit date: 04/18/1997    Years since quitting: 25.7   Smokeless tobacco: Never  Vaping Use   Vaping status: Never Used  Substance and Sexual Activity   Alcohol use: Yes    Alcohol/week: 3.0 standard drinks of alcohol    Types: 1 Glasses of wine, 2 Cans of beer per week    Comment: maybe one drink a month   Drug use: No   Sexual activity: Yes  Other Topics Concern   Not on file  Social History Narrative   Wife passed away--secondary to acute MI   Pt works--binding books, [employer]   Has son and grandchildren nearby   Currently engaged to [place], wedding date set for 04/09/2022.   Social Determinants of Health   Financial Resource Strain: Low Risk  (09/15/2022)   Overall Financial Resource Strain (CARDIA)    Difficulty of Paying Living Expenses: Not hard at all  Food Insecurity: No Food Insecurity (09/15/2022)   Hunger Vital Sign    Worried About Running Out of Food in the Last Year: Never true    Ran Out of Food in the Last Year: Never true  Transportation Needs: No Transportation Needs (09/15/2022)   PRAPARE - Administrator, Civil Service (Medical): No    Lack of Transportation (Non-Medical): No  Physical Activity: Sufficiently Active (09/15/2022)   Exercise Vital Sign    Days of Exercise per Week: 5 days    Minutes of Exercise per Session: 30 min  Stress: No Stress Concern Present (09/15/2022)   [employer] of Occupational Health - Occupational Stress Questionnaire    Feeling of Stress : Not at all  Social Connections: Socially Integrated (09/15/2022)   Social Connection and Isolation Panel [NHANES]    Frequency of Communication with Friends and Family: More than three times a week    Frequency of Social Gatherings with Friends and Family: Three times a week    Attends Religious Services: More than 4 times  per year    Active Member of Clubs or Organizations: Yes    Attends Engineer, structural: More than 4 times per year    Marital Status: Married          Objective:  Physical Exam: BP (!) 146/79   Pulse (!) 53   Temp 98.6 F (37 C) (Temporal)   Ht 6\' 1"  (1.854 m)   Wt 230 lb (104.3 kg)   SpO2 99%   BMI 30.34 kg/m   Gen: No acute distress, resting comfortably CV: Regular rate and rhythm with no murmurs appreciated Pulm: Normal work of breathing,  clear to auscultation bilaterally with no crackles, wheezes, or rhonchi Neuro: Grossly normal, moves all extremities Psych: Normal affect and thought content      Nzinga Ferran M. Jimmey Ralph, MD 01/19/2023 10:42 AM

## 2023-03-23 ENCOUNTER — Encounter: Payer: Self-pay | Admitting: *Deleted

## 2023-03-23 ENCOUNTER — Encounter: Payer: Self-pay | Admitting: Cardiology

## 2023-03-23 ENCOUNTER — Ambulatory Visit: Payer: Medicare HMO | Attending: Cardiology | Admitting: Cardiology

## 2023-03-23 VITALS — BP 140/92 | HR 57 | Resp 16 | Ht 73.0 in | Wt 238.0 lb

## 2023-03-23 DIAGNOSIS — I700 Atherosclerosis of aorta: Secondary | ICD-10-CM | POA: Diagnosis not present

## 2023-03-23 DIAGNOSIS — I10 Essential (primary) hypertension: Secondary | ICD-10-CM

## 2023-03-23 DIAGNOSIS — R7303 Prediabetes: Secondary | ICD-10-CM

## 2023-03-23 DIAGNOSIS — I2510 Atherosclerotic heart disease of native coronary artery without angina pectoris: Secondary | ICD-10-CM | POA: Diagnosis not present

## 2023-03-23 DIAGNOSIS — E782 Mixed hyperlipidemia: Secondary | ICD-10-CM | POA: Diagnosis not present

## 2023-03-23 DIAGNOSIS — R0609 Other forms of dyspnea: Secondary | ICD-10-CM

## 2023-03-23 DIAGNOSIS — I25118 Atherosclerotic heart disease of native coronary artery with other forms of angina pectoris: Secondary | ICD-10-CM

## 2023-03-23 MED ORDER — AMLODIPINE BESYLATE 5 MG PO TABS: 5.0000 mg | ORAL_TABLET | Freq: Every day | ORAL | 2 refills | Status: DC

## 2023-03-23 NOTE — Patient Instructions (Signed)
Medication Instructions:   START TAKING AMLODIPINE 5 MG BY MOUTH DAILY  *If you need a refill on your cardiac medications before your next appointment, please call your pharmacy*   Lab Work:  TODAY--LIPIDS, LIPOPROTEIN A, AND A1C  If you have labs (blood work) drawn today and your tests are completely normal, you will receive your results only by: MyChart Message (if you have MyChart) OR A paper copy in the mail If you have any lab test that is abnormal or we need to change your treatment, we will call you to review the results.   Testing/Procedures:  Your physician has requested that you have an echocardiogram. Echocardiography is a painless test that uses sound waves to create images of your heart. It provides your doctor with information about the size and shape of your heart and how well your heart's chambers and valves are working. This procedure takes approximately one hour. There are no restrictions for this procedure. Please do NOT wear cologne, perfume, aftershave, or lotions (deodorant is allowed). Please arrive 15 minutes prior to your appointment time.  Please note: We ask at that you not bring children with you during ultrasound (echo/ vascular) testing. Due to room size and safety concerns, children are not allowed in the ultrasound rooms during exams. Our front office staff cannot provide observation of children in our lobby area while testing is being conducted. An adult accompanying a patient to their appointment will only be allowed in the ultrasound room at the discretion of the ultrasound technician under special circumstances. We apologize for any inconvenience.    Your physician has requested that you have en exercise stress myoview. For further information please visit [email or web address] Please follow instruction sheet, as given.    Follow-Up: At [facility], you and your health needs are our priority.  As part of our continuing mission to provide  you with exceptional heart care, we have created designated Provider Care Teams.  These Care Teams include your primary Cardiologist (physician) and Advanced Practice Providers (APPs -  Physician Assistants and Nurse Practitioners) who all work together to provide you with the care you need, when you need it.  We recommend signing up for the patient portal called "MyChart".  Sign up information is provided on this After Visit Summary.  MyChart is used to connect with patients for Virtual Visits (Telemedicine).  Patients are able to view lab/test results, encounter notes, upcoming appointments, etc.  Non-urgent messages can be sent to your provider as well.   To learn more about what you can do with MyChart, go to [email or web address].    Your next appointment:   6 month(s)  Provider:   DR. Rosemary Holms

## 2023-03-23 NOTE — Progress Notes (Addendum)
Cardiology Office Note:  .   Date:  03/23/2023  ID:  Frederick Hicks, DOB [birth year], MRN [id number] PCP: Ardith Dark, MD  [facility] Providers Cardiologist:  Truett Mainland, MD PCP: Ardith Dark, MD  Chief Complaint  Patient presents with   coronary artery calcification    New Patient (Initial Visit)      History of Present Illness: .    Frederick Hicks is a 67 y.o. 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  He was recently found to have coronary calcification in the left main as well as arteries, CT chest for lung cancer screening.  Patient denies any complaints of chest pain, has mild exertional dyspnea.  He quit smoking 5 years ago, but has been diagnosed with COPD.  He had been on rosuvastatin 10 mg daily, it was recently increased to 20 mg daily after lipid panel in 10/2022.    Vitals:   03/23/23 1103  BP: (!) 140/92  Pulse: (!) 57  Resp: 16  SpO2: 97%     ROS:  Review of Systems  Cardiovascular:  Positive for dyspnea on exertion. Negative for chest pain, leg swelling, palpitations and syncope.     Studies Reviewed: Marland Kitchen        EKG 03/23/2023: Sinus rhythm 57 bpm Cannot rule out Anteroseptal infarct , age undetermined When compared with ECG of 15-Apr-2021 11:50, No significant change was found    Independently interpreted 10/2022: Chol 200, TG 223, HDL 37, LDL 127 HbA1C 6% Hb 15 Cr 1.0  CT chest 10/2022: 1. Lung-RADS 2S, benign appearance or behavior. Continue annual screening with low-dose chest CT without contrast in 12 months. 2. The "S" modifier above refers to potentially clinically significant non lung cancer related findings. Specifically, there is aortic atherosclerosis, in addition to left main and 2 vessel coronary artery disease. Please note that although the presence of coronary artery calcium documents  the presence of coronary artery disease, the severity of this disease and any potential stenosis cannot be assessed on this non-gated CT examination. Assessment for potential risk factor modification, dietary therapy or pharmacologic therapy may be warranted, if clinically indicated. 3. Mild diffuse bronchial wall thickening with mild centrilobular and paraseptal emphysema; imaging findings suggestive of underlying COPD. 4. Hepatic steatosis.   Aortic Atherosclerosis (ICD10-I70.0) and Emphysema (ICD10-J43.9).   Physical Exam:   Physical Exam Vitals and nursing note reviewed.  Constitutional:      General: He is not in acute distress. Neck:     Vascular: No JVD.  Cardiovascular:     Rate and Rhythm: Normal rate and regular rhythm.     Heart sounds: Normal heart sounds. No murmur heard. Pulmonary:     Effort: Pulmonary effort is normal.     Breath sounds: Normal breath sounds. No wheezing or rales.  Musculoskeletal:     Right lower leg: No edema.     Left lower leg: No edema.      VISIT DIAGNOSES:   ICD-10-CM   1. Coronary artery calcification  I25.10 EKG 12-Lead    MYOCARDIAL PERFUSION IMAGING    Cardiac Stress Test: Informed Consent Details: Physician/Practitioner Attestation; Transcribe to consent form and obtain patient signature    2. Aortic atherosclerosis (HCC)  I70.0 MYOCARDIAL PERFUSION IMAGING    Cardiac Stress Test: Informed Consent Details: Physician/Practitioner Attestation; Transcribe to consent form and obtain patient signature    3.  Coronary artery disease involving native coronary artery of native heart with other form of angina pectoris (HCC)  I25.118 MYOCARDIAL PERFUSION IMAGING    Cardiac Stress Test: Informed Consent Details: Physician/Practitioner Attestation; Transcribe to consent form and obtain patient signature    4. Exertional dyspnea  R06.09 MYOCARDIAL PERFUSION IMAGING    ECHOCARDIOGRAM COMPLETE    Cardiac Stress Test: Informed Consent  Details: Physician/Practitioner Attestation; Transcribe to consent form and obtain patient signature    5. Mixed hyperlipidemia  E78.2 Lipid Profile    Lipoprotein A (LPA)    Cardiac Stress Test: Informed Consent Details: Physician/Practitioner Attestation; Transcribe to consent form and obtain patient signature    6. Primary hypertension  I10 amLODipine (NORVASC) 5 MG tablet    Cardiac Stress Test: Informed Consent Details: Physician/Practitioner Attestation; Transcribe to consent form and obtain patient signature    7. Prediabetes  R73.03 HgB A1c       ASSESSMENT AND PLAN: .    Frederick Hicks is a 67 y.o. male with hypertension, hyperlipidemia, former smoker, referred for coronary calcification  Coronary calcification:  Patient has no chest pain symptoms, but has baseline exertional dyspnea.  While this could be due to COPD, agenic for radiculopathy excluded.  Recommend exercise nuclear stress test, echocardiogram. Given left main calcification, continue aspirin 80 mg daily. Continue Crestor 20 mg daily.  Check lipid panel and lipoprotein (a). Given his prediabetes, will also check A1c.  Hypertension: In addition to heart healthy and low-salt diet, and regular exercise, recommend adding amlodipine 5 mg daily.    Informed Consent   Shared Decision Making/Informed Consent The risks [chest pain, shortness of breath, cardiac arrhythmias, dizziness, blood pressure fluctuations, myocardial infarction, stroke/transient ischemic attack, nausea, vomiting, allergic reaction, radiation exposure, metallic taste sensation and life-threatening complications (estimated to be 1 in 10,000)], benefits (risk stratification, diagnosing coronary artery disease, treatment guidance) and alternatives of a nuclear stress test were discussed in detail with Frederick Hicks and he agrees to proceed.       No orders of the defined types were placed in this encounter.    F/u in 6 months  Signed, Elder Negus, MD

## 2023-03-24 LAB — LIPID PANEL
Chol/HDL Ratio: 4.9 {ratio} (ref 0.0–5.0)
Cholesterol, Total: 161 mg/dL (ref 100–199)
HDL: 33 mg/dL — ABNORMAL LOW (ref >39)
LDL Chol Calc (NIH): 93 mg/dL (ref 0–99)
Triglycerides: 204 mg/dL — ABNORMAL HIGH (ref 0–149)
VLDL Cholesterol Cal: 35 mg/dL (ref 5–40)

## 2023-03-24 LAB — LIPOPROTEIN A (LPA): Lipoprotein (a): 44.2 nmol/L (ref <75.0)

## 2023-03-24 LAB — HEMOGLOBIN A1C
Est. average glucose Bld gHb Est-mCnc: 131 mg/dL
Hgb A1c MFr Bld: 6.2 % — ABNORMAL HIGH (ref 4.8–5.6)

## 2023-03-24 NOTE — Progress Notes (Signed)
Hemoglobin A1c is mildly increased from 6.0-6.2%, placing you in prediabetic range.  Consider discussing with PCP if metformin would help you, in addition to low sugar diet and regular exercise.   Your cholesterol is mildly elevated with LDL of 93 and triglyceride of 204.  It is certainly improved compared to July 2024. We should aim to bring LDL down <70, and triglycerides <150. Lipoprotein (a) is normal. If you're okay, I would like you to increase Crestor to 40 mg daily and repeat lipid panel in 3 months.   Thanks MJP

## 2023-03-27 ENCOUNTER — Encounter (HOSPITAL_COMMUNITY): Payer: Self-pay

## 2023-03-27 ENCOUNTER — Telehealth: Payer: Self-pay | Admitting: *Deleted

## 2023-03-27 ENCOUNTER — Other Ambulatory Visit: Payer: Self-pay | Admitting: *Deleted

## 2023-03-27 DIAGNOSIS — E7800 Pure hypercholesterolemia, unspecified: Secondary | ICD-10-CM

## 2023-03-27 DIAGNOSIS — E785 Hyperlipidemia, unspecified: Secondary | ICD-10-CM

## 2023-03-27 MED ORDER — ROSUVASTATIN CALCIUM 40 MG PO TABS: 40.0000 mg | ORAL_TABLET | Freq: Every day | ORAL | 3 refills | Status: DC

## 2023-03-27 NOTE — Telephone Encounter (Signed)
Spoke to pt regarding Dr. Damian Leavell recommendations for increasing Crestor to 40 mg Daily; also to repeat lipid panel in 3 months.  Pt in agreement. Orders entered.  He would like a phone call regarding Stress test scheduled 03/30/23.

## 2023-03-30 ENCOUNTER — Ambulatory Visit (HOSPITAL_COMMUNITY): Payer: Medicare HMO | Attending: Cardiology

## 2023-03-30 DIAGNOSIS — I2510 Atherosclerotic heart disease of native coronary artery without angina pectoris: Secondary | ICD-10-CM | POA: Diagnosis not present

## 2023-03-30 DIAGNOSIS — I700 Atherosclerosis of aorta: Secondary | ICD-10-CM | POA: Diagnosis not present

## 2023-03-30 DIAGNOSIS — R0609 Other forms of dyspnea: Secondary | ICD-10-CM | POA: Insufficient documentation

## 2023-03-30 DIAGNOSIS — I25118 Atherosclerotic heart disease of native coronary artery with other forms of angina pectoris: Secondary | ICD-10-CM | POA: Insufficient documentation

## 2023-03-30 LAB — MYOCARDIAL PERFUSION IMAGING
Estimated workload: 10.1
Exercise duration (min): 8 min
Exercise duration (sec): 1 s
LV dias vol: 84 mL (ref 62–150)
LV sys vol: 38 mL
MPHR: 152 {beats}/min
Nuc Stress EF: 55 %
Peak HR: 146 {beats}/min
Percent HR: 96 %
Rest HR: 55 {beats}/min
Rest Nuclear Isotope Dose: 9.6 mCi
SDS: 0
SRS: 0
SSS: 0
ST Depression (mm): 0 mm
Stress Nuclear Isotope Dose: 30.8 mCi
TID: 0.94

## 2023-03-30 MED ORDER — TECHNETIUM TC 99M TETROFOSMIN IV KIT
9.6000 | PACK | Freq: Once | INTRAVENOUS | Status: AC | PRN
  Administered 2023-03-30: 9.6 via INTRAVENOUS

## 2023-03-30 MED ORDER — TECHNETIUM TC 99M TETROFOSMIN IV KIT
30.8000 | PACK | Freq: Once | INTRAVENOUS | Status: AC | PRN
  Administered 2023-03-30: 30.8 via INTRAVENOUS

## 2023-04-25 ENCOUNTER — Other Ambulatory Visit: Payer: Self-pay | Admitting: Family Medicine

## 2023-04-25 DIAGNOSIS — E7800 Pure hypercholesterolemia, unspecified: Secondary | ICD-10-CM

## 2023-04-25 DIAGNOSIS — E785 Hyperlipidemia, unspecified: Secondary | ICD-10-CM

## 2023-05-02 ENCOUNTER — Other Ambulatory Visit (HOSPITAL_COMMUNITY): Payer: Self-pay | Admitting: Cardiology

## 2023-05-02 DIAGNOSIS — R0609 Other forms of dyspnea: Secondary | ICD-10-CM

## 2023-05-04 ENCOUNTER — Ambulatory Visit (HOSPITAL_COMMUNITY): Payer: Medicare HMO

## 2023-05-23 ENCOUNTER — Ambulatory Visit (HOSPITAL_COMMUNITY): Payer: Medicare HMO | Attending: Internal Medicine

## 2023-05-23 DIAGNOSIS — R0609 Other forms of dyspnea: Secondary | ICD-10-CM | POA: Insufficient documentation

## 2023-05-23 LAB — ECHOCARDIOGRAM COMPLETE
Area-P 1/2: 4.35 cm2
S' Lateral: 3.4 cm

## 2023-05-29 ENCOUNTER — Telehealth: Payer: Self-pay | Admitting: *Deleted

## 2023-05-29 DIAGNOSIS — I77810 Thoracic aortic ectasia: Secondary | ICD-10-CM

## 2023-05-29 NOTE — Telephone Encounter (Signed)
 The patient has been notified of the result and verbalized understanding.  All questions (if any) were answered.  Pt aware that we will order for him to have a repeat echo done in one year for surveillance.  He is aware that I will place the order and have our Echo Scheduler reach out to him closer to that time to arrange this appt.   Pt verbalized understanding and agrees with this plan.

## 2023-05-29 NOTE — Telephone Encounter (Signed)
-----   Message from [facility] sent at 05/24/2023  6:34 PM EST ----- Normal pumping function of the heart. No severe heart valve abnormalities noted.  Mildly dilated aortic root at 40 mm. Recommend repeat echocardiogram in 1 year.   Thanks MJP

## 2023-08-03 DIAGNOSIS — H25041 Posterior subcapsular polar age-related cataract, right eye: Secondary | ICD-10-CM | POA: Diagnosis not present

## 2023-08-03 DIAGNOSIS — H2511 Age-related nuclear cataract, right eye: Secondary | ICD-10-CM | POA: Diagnosis not present

## 2023-09-21 ENCOUNTER — Ambulatory Visit: Payer: Medicare HMO

## 2023-10-27 ENCOUNTER — Encounter: Payer: Medicare HMO | Admitting: Family Medicine

## 2023-12-26 ENCOUNTER — Other Ambulatory Visit: Payer: Self-pay | Admitting: Cardiology

## 2023-12-26 DIAGNOSIS — I10 Essential (primary) hypertension: Secondary | ICD-10-CM

## 2023-12-28 ENCOUNTER — Other Ambulatory Visit: Payer: Self-pay | Admitting: *Deleted

## 2023-12-28 MED ORDER — COVID-19 MRNA VAC-TRIS(PFIZER) 30 MCG/0.3ML IM SUSY: 0.3000 mL | PREFILLED_SYRINGE | Freq: Once | INTRAMUSCULAR | 0 refills | Status: AC

## 2024-01-25 ENCOUNTER — Ambulatory Visit

## 2024-01-25 ENCOUNTER — Encounter: Payer: Medicare HMO | Admitting: Family Medicine

## 2024-01-25 ENCOUNTER — Telehealth: Payer: Self-pay | Admitting: *Deleted

## 2024-01-25 NOTE — Telephone Encounter (Signed)
 Copied from CRM [id number]. Topic: General - Other >> Jan 24, 2024 12:19 PM Amy B wrote: Reason for CRM: Patient wanted provider to know he received his flu shot on 10/05.   Immunization records updated  [employer]

## 2024-02-20 ENCOUNTER — Encounter: Admitting: Family Medicine

## 2024-02-20 ENCOUNTER — Ambulatory Visit: Admitting: Family Medicine

## 2024-02-20 VITALS — BP 144/90 | HR 53 | Temp 98.4°F | Resp 14 | Ht 73.0 in | Wt 235.8 lb

## 2024-02-20 DIAGNOSIS — I10 Essential (primary) hypertension: Secondary | ICD-10-CM | POA: Diagnosis not present

## 2024-02-20 DIAGNOSIS — R739 Hyperglycemia, unspecified: Secondary | ICD-10-CM

## 2024-02-20 DIAGNOSIS — E785 Hyperlipidemia, unspecified: Secondary | ICD-10-CM | POA: Diagnosis not present

## 2024-02-20 DIAGNOSIS — Z125 Encounter for screening for malignant neoplasm of prostate: Secondary | ICD-10-CM

## 2024-02-20 DIAGNOSIS — Z0001 Encounter for general adult medical examination with abnormal findings: Secondary | ICD-10-CM

## 2024-02-20 DIAGNOSIS — Z0000 Encounter for general adult medical examination without abnormal findings: Secondary | ICD-10-CM

## 2024-02-20 DIAGNOSIS — E559 Vitamin D deficiency, unspecified: Secondary | ICD-10-CM | POA: Diagnosis not present

## 2024-02-20 DIAGNOSIS — Z23 Encounter for immunization: Secondary | ICD-10-CM

## 2024-02-20 DIAGNOSIS — B001 Herpesviral vesicular dermatitis: Secondary | ICD-10-CM

## 2024-02-20 DIAGNOSIS — L989 Disorder of the skin and subcutaneous tissue, unspecified: Secondary | ICD-10-CM

## 2024-02-20 DIAGNOSIS — Z1211 Encounter for screening for malignant neoplasm of colon: Secondary | ICD-10-CM

## 2024-02-20 DIAGNOSIS — I2510 Atherosclerotic heart disease of native coronary artery without angina pectoris: Secondary | ICD-10-CM

## 2024-02-20 DIAGNOSIS — I2583 Coronary atherosclerosis due to lipid rich plaque: Secondary | ICD-10-CM

## 2024-02-20 LAB — LIPID PANEL
Cholesterol: 116 mg/dL (ref 0–200)
HDL: 35.5 mg/dL — ABNORMAL LOW (ref >39.00)
LDL Cholesterol: 58 mg/dL (ref 0–99)
NonHDL: 80.91
Total CHOL/HDL Ratio: 3
Triglycerides: 115 mg/dL (ref 0.0–149.0)
VLDL: 23 mg/dL (ref 0.0–40.0)

## 2024-02-20 LAB — CBC
HCT: 43.9 % (ref 39.0–52.0)
Hemoglobin: 14.6 g/dL (ref 13.0–17.0)
MCHC: 33.3 g/dL (ref 30.0–36.0)
MCV: 90.9 fl (ref 78.0–100.0)
Platelets: 236 K/uL (ref 150.0–400.0)
RBC: 4.83 Mil/uL (ref 4.22–5.81)
RDW: 13.8 % (ref 11.5–15.5)
WBC: 6.3 K/uL (ref 4.0–10.5)

## 2024-02-20 LAB — COMPREHENSIVE METABOLIC PANEL WITH GFR
ALT: 32 U/L (ref 0–53)
AST: 23 U/L (ref 0–37)
Albumin: 4.3 g/dL (ref 3.5–5.2)
Alkaline Phosphatase: 68 U/L (ref 39–117)
BUN: 13 mg/dL (ref 6–23)
CO2: 29 meq/L (ref 19–32)
Calcium: 9.5 mg/dL (ref 8.4–10.5)
Chloride: 105 meq/L (ref 96–112)
Creatinine, Ser: 1.1 mg/dL (ref 0.40–1.50)
GFR: 68.39 mL/min (ref >60.00)
Glucose, Bld: 88 mg/dL (ref 70–99)
Potassium: 4.7 meq/L (ref 3.5–5.1)
Sodium: 140 meq/L (ref 135–145)
Total Bilirubin: 0.5 mg/dL (ref 0.2–1.2)
Total Protein: 6.8 g/dL (ref 6.0–8.3)

## 2024-02-20 LAB — HEMOGLOBIN A1C: Hgb A1c MFr Bld: 6.3 % (ref 4.6–6.5)

## 2024-02-20 MED ORDER — VALACYCLOVIR HCL 1 G PO TABS: 2000.0000 mg | ORAL_TABLET | Freq: Two times a day (BID) | ORAL | 1 refills | Status: AC | PRN

## 2024-02-20 NOTE — Assessment & Plan Note (Signed)
Check vitamin D with labs.

## 2024-02-20 NOTE — Assessment & Plan Note (Signed)
 Will refer to dermatology for hyperpigmented lesions on face.

## 2024-02-20 NOTE — Progress Notes (Signed)
 Chief Complaint:  [name] is a 68 y.o. male who presents today for his annual comprehensive physical exam.    Assessment/Plan:  Chronic Problems Addressed Today: Dyslipidemia On Crestor  10 mg daily per cardiology.  Tolerating well.  Check labs today.  Vitamin D  deficiency Check vitamin D  with labs  Coronary artery disease On aspirin and statin per cardiology.  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  We discussed treatment options.  Will start as needed Valtrex  2000 mg twice daily for 1 day as needed for flareups.  May consider prophylactic if continues to have frequent flareups despite this.  Skin lesion Will refer to dermatology for hyperpigmented lesions on face.  Essential hypertension Mildly elevated today.  Typically well-controlled.  Frederick Hicks is on amlodipine  5 mg daily.  We did discuss increasing dose to 10 mg daily however would continue with current dose now per cardiology.  Frederick Hicks will monitor at home and follow-up with us  if persistently elevated.   Preventative Healthcare: Check labs.  Prevnar 20 given today.  Will refer for colon cancer screening.  Patient Counseling(The following topics were reviewed and/or handout was given):  -Nutrition: Stressed importance of moderation in sodium/caffeine intake, saturated fat and cholesterol, caloric balance, sufficient intake of fresh fruits, vegetables, and fiber.  -Stressed the importance of regular exercise.   -Substance Abuse: Discussed cessation/primary prevention of tobacco, alcohol, or other drug use; driving or other dangerous activities under the influence; availability of treatment for abuse.   -Injury prevention: Discussed safety belts, safety helmets, smoke detector, smoking near bedding or upholstery.   -Sexuality: Discussed sexually transmitted diseases, partner selection, use of condoms, avoidance of unintended pregnancy and contraceptive alternatives.   -Dental health: Discussed importance of  regular tooth brushing, flossing, and dental visits.  -Health maintenance and immunizations reviewed. Please refer to Health maintenance section.  Return to care in 1 year for next preventative visit.     Subjective:  HPI:  Frederick Hicks has no acute complaints today. Patient is here today for his annual physical.  See assessment / plan for status of chronic conditions.  Discussed the use of AI scribe software for clinical note transcription with the patient, who gave verbal consent to proceed.  History of Present Illness Frederick Hicks is a 68 year old male who presents for an annual physical exam.  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 Frederick Hicks enjoys doing projects around the house, such as building a deck and doing yard work, and is involved with his church.  Frederick Hicks has a history of hypertension, noting that his blood pressure tends to rise with weight gain. Frederick Hicks is currently about ten pounds overweight and has not been checking his blood pressure at home recently.  Frederick Hicks has a history of COPD and has recently received his COVID and flu vaccines. Frederick Hicks is considering the pneumonia vaccine due to his COPD and heart history.  Frederick Hicks experiences cold sores three to four times a year, often triggered by stress, recurring in the same spot. Frederick Hicks is interested in treatment options to manage them.  Frederick Hicks describes himself as a 'candy guy' and has been borderline for high blood sugar in the past. Frederick Hicks is mindful of his diet, with his daughter helping him focus on eating fruits and vegetables.  Frederick Hicks has noticed age spots on his face that bother him for cosmetic reasons. Frederick Hicks also reports getting blisters in the same  spot on his lip, which Frederick Hicks associates with stress.  Frederick Hicks is considering starting yoga to help with general aches and to maintain his activity level.     Lifestyle Diet: Balanced. Plenty of fruits and vegetables.  Exercise: Very busy around  the hose and with work.      02/20/2024    8:19 AM  Depression screen PHQ 2/9  Decreased Interest 0  Down, Depressed, Hopeless 0  PHQ - 2 Score 0    Health Maintenance Due  Topic Date Due   Medicare Annual Wellness (AWV)  10/27/2023     ROS: Per HPI, otherwise a complete review of systems was negative.   PMH:  The following were reviewed and entered/updated in epic: Past Medical History:  Diagnosis Date   Hiatal hernia    Hyperlipidemia    Hypertension    Pre-diabetes    Patient Active Problem List   Diagnosis Date Noted   Cold sore 02/20/2024   Skin lesion 02/20/2024   Aortic atherosclerosis 03/23/2023   Exertional dyspnea 03/23/2023   Mixed hyperlipidemia 03/23/2023   History of anaphylaxis 01/19/2023   Coronary artery disease 01/19/2023   Former smoker 01/19/2023   Vitamin D  deficiency 03/04/2015   Essential hypertension 02/11/2015   Dyslipidemia 02/11/2015   Past Surgical History:  Procedure Laterality Date   ANKLE FRACTURE SURGERY     left, pinning   COLONOSCOPY  04/01/2015   INGUINAL HERNIA REPAIR Right 04/26/2021   Procedure: OPEN RIGHT INGUINAL HERNIA REPAIR WITH MESH;  Surgeon: Paola Dreama SAILOR, MD;  Location: [facility] OR;  Service: General;  Laterality: Right;   INSERTION OF MESH Right 04/26/2021   Procedure: INSERTION OF MESH;  Surgeon: Paola Dreama SAILOR, MD;  Location: [facility] OR;  Service: General;  Laterality: Right;   POLYPECTOMY     TONSILLECTOMY      Family History  Problem Relation Age of Onset   Cancer Father 61       lung 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 90 tablet 0   aspirin 81  MG tablet Take 81 mg by mouth daily.     meloxicam  (MOBIC ) 15 MG tablet Take 15 mg by mouth daily as needed for pain.     rosuvastatin  (CRESTOR ) 40 MG tablet Take 1 tablet (40 mg total) by mouth daily. 90 tablet 3   tizanidine (ZANAFLEX) 2 MG capsule Take 2 mg by mouth 3 (three) times daily.     valACYclovir  (VALTREX ) 1000 MG tablet Take 2 tablets (2,000 mg total) by mouth 2 (two) times daily as needed (cold sore). Take for 1 day 30 tablet 1   No current facility-administered medications for this visit.    Allergies-reviewed and updated Allergies  Allergen Reactions   Prednisone  Rash   Lisinopril Cough   Poison Ivy Extract     Had systemic urticarial reaction to poison ivy   Simvastatin     Mylagias    Social History   Socioeconomic History   Marital status: Widowed    Spouse name: Not on file   Number of  children: 2   Years of education: Not on file   Highest education level: 12th grade  Occupational History   Occupation: Engineering  Tobacco Use   Smoking status: Former    Types: Cigars    Quit date: 04/18/1997    Years since quitting: 26.8   Smokeless tobacco: Never  Vaping Use   Vaping status: Never Used  Substance and Sexual Activity   Alcohol use: Yes    Alcohol/week: 3.0 standard drinks of alcohol    Types: 1 Glasses of wine, 2 Cans of beer per week    Comment: maybe one drink a month   Drug use: No   Sexual activity: Yes  Other Topics Concern   Not on file  Social History Narrative   Wife passed away--secondary to acute MI   Pt works--binding books, [employer]   Has son and grandchildren nearby   Currently engaged to Halsey, wedding date set for 04/09/2022.   Social Drivers of Corporate Investment Banker Strain: Low Risk  (02/19/2024)   Overall Financial Resource Strain (CARDIA)    Difficulty of Paying Living Expenses: Not hard at all  Food Insecurity: No Food Insecurity (02/19/2024)   Hunger Vital Sign    Worried About Running Out of Food in the Last  Year: Never true    Ran Out of Food in the Last Year: Never true  Transportation Needs: No Transportation Needs (02/19/2024)   PRAPARE - Administrator, Civil Service (Medical): No    Lack of Transportation (Non-Medical): No  Physical Activity: Sufficiently Active (02/19/2024)   Exercise Vital Sign    Days of Exercise per Week: 5 days    Minutes of Exercise per Session: 100 min  Stress: No Stress Concern Present (02/19/2024)   [employer] of Occupational Health - Occupational Stress Questionnaire    Feeling of Stress: Not at all  Social Connections: Moderately Integrated (02/19/2024)   Social Connection and Isolation Panel    Frequency of Communication with Friends and Family: Once a week    Frequency of Social Gatherings with Friends and Family: Once a week    Attends Religious Services: More than 4 times per year    Active Member of [employer] or Organizations: Yes    Attends Engineer, Structural: More than 4 times per year    Marital Status: Married        Objective:  Physical Exam: BP (!) 144/90   Pulse (!) 53   Temp 98.4 F (36.9 C) (Temporal)   Resp 14   Ht 6' 1 (1.854 m)   Wt 235 lb 12.8 oz (107 kg)   SpO2 98%   BMI 31.11 kg/m   Body mass index is 31.11 kg/m. Wt Readings from Last 3 Encounters:  02/20/24 235 lb 12.8 oz (107 kg)  03/23/23 238 lb (108 kg)  01/19/23 230 lb (104.3 kg)   Gen: NAD, resting comfortably HEENT: TMs normal bilaterally. OP clear. No thyromegaly noted.  CV: RRR with no murmurs appreciated Pulm: NWOB, CTAB with no crackles, wheezes, or rhonchi GI: Normal bowel sounds present. Soft, Nontender, Nondistended. MSK: no edema, cyanosis, or clubbing noted.  Hyperpigmented lesion on left face consistent with seborrheic keratosis. Skin: warm, dry Neuro: CN2-12 grossly intact. Strength 5/5 in upper and lower extremities. Reflexes symmetric and intact bilaterally.  Psych: Normal affect and thought content     Sherran Margolis M. Kennyth,  MD 02/20/2024 9:43 AM

## 2024-02-20 NOTE — Patient Instructions (Signed)
 It was very nice to see you today!  VISIT SUMMARY: Today, you had your annual physical exam. We discussed your blood pressure, cholesterol, blood sugar levels, cold sores, age spots, and general health maintenance. You received the Prevnar 20 vaccine and were referred for a colonoscopy and dermatological evaluation.  YOUR PLAN: ESSENTIAL HYPERTENSION: Your blood pressure is elevated, likely due to weight gain. -Monitor your blood pressure at home regularly. -Reduce your sodium intake. -Increase your cardiovascular exercise.  HYPERLIPIDEMIA: Your cholesterol levels need monitoring. -Blood work is ordered to assess your cholesterol levels.  PRE-DIABETES: Your blood glucose levels are borderline. -Limit your sugar intake, especially during holidays. -Blood work is ordered to assess your blood glucose levels.  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 -Valtrex  is prescribed for episodic treatment to prevent full outbreaks.  AGE SPOTS: You have facial age spots that are a cosmetic concern. -You are referred to a dermatologist for evaluation and treatment options like cryotherapy and laser treatment.  GENERAL HEALTH MAINTENANCE: Your vaccinations are current, and we discussed the Prevnar 20 vaccine due to your COPD and cardiac history. -You received the Prevnar 20 vaccine today. -A colonoscopy is due based on previous polyp findings; you are referred for this. -Comprehensive blood work is ordered to assess kidney function, liver function, electrolytes, thyroid, PSA, and vitamin D .  FOLLOW-UP: We will review your blood work results and health maintenance activities. -Schedule a routine follow-up in one year.  Return in about 1 year (around 02/19/2025).   Take care, Dr Kennyth  PLEASE NOTE:  If you had any lab tests, please let us  know if you have not heard back within a few days. You may see your results on mychart before we have a  chance to review them but we will give you a call once they are reviewed by us .   If we ordered any referrals today, please let us  know if you have not heard from their office within the next week.   If you had any urgent prescriptions sent in today, please check with the pharmacy within an hour of our visit to make sure the prescription was transmitted appropriately.   Please try these tips to maintain a healthy lifestyle:  Eat at least 3 REAL meals and 1-2 snacks per day.  Aim for no more than 5 hours between eating.  If you eat breakfast, please do so within one hour of getting up.   Each meal should contain half fruits/vegetables, one quarter protein, and one quarter carbs (no bigger than a computer mouse)  Cut down on sweet beverages. This includes juice, soda, and sweet tea.   Drink at least 1 glass of water with each meal and aim for at least 8 glasses per day  Exercise at least 150 minutes every week.    Preventive Care 40 Years and Older, Male Preventive care refers to lifestyle choices and visits with your health care provider that can promote health and wellness. Preventive care visits are also called wellness exams. What can I expect for my preventive care visit? Counseling During your preventive care visit, your health care provider may ask about your: Medical history, including: Past medical problems. Family medical history. History of falls. Current health, including: Emotional well-being. Home life and relationship well-being. Sexual activity. Memory and ability to understand (cognition). Lifestyle, including: Alcohol, nicotine or tobacco, and drug use. Access to firearms. Diet, exercise, and sleep habits. Work and work astronomer. Sunscreen use. Safety issues such  as seatbelt and bike helmet use. Physical exam Your health care provider will check your: Height and weight. These may be used to calculate your BMI (body mass index). BMI is a measurement that  tells if you are at a healthy weight. Waist circumference. This measures the distance around your waistline. This measurement also tells if you are at a healthy weight and may help predict your risk of certain diseases, such as type 2 diabetes and high blood pressure. Heart rate and blood pressure. Body temperature. Skin for abnormal spots. What immunizations do I need?  Vaccines are usually given at various ages, according to a schedule. Your health care provider will recommend vaccines for you based on your age, medical history, and lifestyle or other factors, such as travel or where you work. What tests do I need? Screening Your health care provider may recommend screening tests for certain conditions. This may include: Lipid and cholesterol levels. Diabetes screening. This is done by checking your blood sugar (glucose) after you have not eaten for a while (fasting). Hepatitis C test. Hepatitis B test. HIV (human immunodeficiency virus) test. STI (sexually transmitted infection) testing, if you are at risk. Lung cancer screening. Colorectal cancer screening. Prostate cancer screening. Abdominal aortic aneurysm (AAA) screening. You may need this if you are a current or former smoker. Talk with your health care provider about your test results, treatment options, and if necessary, the need for more tests. Follow these instructions at home: Eating and drinking  Eat a diet that includes fresh fruits and vegetables, whole grains, lean protein, and low-fat dairy products. Limit your intake of foods with high amounts of sugar, saturated fats, and salt. Take vitamin and mineral supplements as recommended by your health care provider. Do not drink alcohol if your health care provider tells you not to drink. If you drink alcohol: Limit how much you have to 0-2 drinks a day. Know how much alcohol is in your drink. In the [place] one drink equals one 12 oz bottle of beer (355 mL), one 5 oz glass  of wine (148 mL), or one 1 oz glass of hard liquor (44 mL). Lifestyle Brush your teeth every morning and night with fluoride toothpaste. Floss one time each day. Exercise for at least 30 minutes 5 or more days each week. Do not use any products that contain nicotine or tobacco. These products include cigarettes, chewing tobacco, and vaping devices, such as e-cigarettes. If you need help quitting, ask your health care provider. Do not use drugs. If you are sexually active, practice safe sex. Use a condom or other form of protection to prevent STIs. Take aspirin only as told by your health care provider. Make sure that you understand how much to take and what form to take. Work with your health care provider to find out whether it is safe and beneficial for you to take aspirin daily. Ask your health care provider if you need to take a cholesterol-lowering medicine (statin). Find healthy ways to manage stress, such as: Meditation, yoga, or listening to music. Journaling. Talking to a trusted person. Spending time with friends and family. Safety Always wear your seat belt while driving or riding in a vehicle. Do not drive: If you have been drinking alcohol. Do not ride with someone who has been drinking. When you are tired or distracted. While texting. If you have been using any mind-altering substances or drugs. Wear a helmet and other protective equipment during sports activities. If you have firearms in  your house, make sure you follow all gun safety procedures. Minimize exposure to UV radiation to reduce your risk of skin cancer. What's next? Visit your health care provider once a year for an annual wellness visit. Ask your health care provider how often you should have your eyes and teeth checked. Stay up to date on all vaccines. This information is not intended to replace advice given to you by your health care provider. Make sure you discuss any questions you have with your health  care provider. Document Revised: 09/30/2020 Document Reviewed: 09/30/2020 Elsevier Patient Education  2024 [employer].

## 2024-02-20 NOTE — Assessment & Plan Note (Signed)
 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  We discussed treatment options.  Will start as needed Valtrex  2000 mg twice daily for 1 day as needed for flareups.  May consider prophylactic if continues to have frequent flareups despite this.

## 2024-02-20 NOTE — Assessment & Plan Note (Signed)
 On Crestor  10 mg daily per cardiology.  Tolerating well.  Check labs today.

## 2024-02-20 NOTE — Assessment & Plan Note (Addendum)
 Mildly elevated today.  Typically well-controlled.  He is on amlodipine  5 mg daily.  We did discuss increasing dose to 10 mg daily however would continue with current dose now per cardiology.  He will monitor at home and follow-up with us  if persistently elevated.

## 2024-02-20 NOTE — Assessment & Plan Note (Signed)
On aspirin and statin per cardiology.

## 2024-02-21 LAB — PSA: PSA: 2.05 ng/mL (ref 0.10–4.00)

## 2024-02-21 LAB — TSH: TSH: 1.93 u[IU]/mL (ref 0.35–5.50)

## 2024-02-21 LAB — VITAMIN D 25 HYDROXY (VIT D DEFICIENCY, FRACTURES): VITD: 28.65 ng/mL — ABNORMAL LOW (ref 30.00–100.00)

## 2024-02-23 ENCOUNTER — Ambulatory Visit: Payer: Self-pay | Admitting: Family Medicine

## 2024-02-23 NOTE — Progress Notes (Signed)
 His vitamin D  is a little bit low.  Recommend starting vitamin D  5000 IUs daily if he is not doing so already.  We should recheck in 3 months.  His A1c is borderline elevated but similar to last year.  All of his other labs are at goal.  Do not need to make any changes to his treatment plan.He should continue to work on diet and exercise and we can recheck everything in a year or so.

## 2024-02-29 ENCOUNTER — Ambulatory Visit

## 2024-03-04 ENCOUNTER — Ambulatory Visit (INDEPENDENT_AMBULATORY_CARE_PROVIDER_SITE_OTHER)

## 2024-03-04 VITALS — BP 138/76 | HR 66 | Temp 97.3°F | Ht 73.5 in | Wt 235.2 lb

## 2024-03-04 DIAGNOSIS — Z0000 Encounter for general adult medical examination without abnormal findings: Secondary | ICD-10-CM | POA: Diagnosis not present

## 2024-03-04 NOTE — Patient Instructions (Signed)
 Frederick Hicks,  Thank you for taking the time for your Medicare Wellness Visit. I appreciate your continued commitment to your health goals. Please review the care plan we discussed, and feel free to reach out if I can assist you further.  Please note that Annual Wellness Visits do not include a physical exam. Some assessments may be limited, especially if the visit was conducted virtually. If needed, we may recommend an in-person follow-up with your provider.  Ongoing Care Seeing your primary care provider every 3 to 6 months helps us  monitor your health and provide consistent, personalized care.   Referrals If a referral was made during today's visit and you haven't received any updates within two weeks, please contact the referred provider directly to check on the status.  Recommended Screenings:  Health Maintenance  Topic Date Due   Colon Cancer Screening  02/19/2025*   Hepatitis C Screening  02/19/2025*   COVID-19 Vaccine (10 - 2025-26 season) 06/29/2024   Medicare Annual Wellness Visit  03/04/2025   DTaP/Tdap/Td vaccine (3 - Td or Tdap) 11/26/2031   Pneumococcal Vaccine for age over 78  Completed   Flu Shot  Completed   Zoster (Shingles) Vaccine  Completed   Meningitis B Vaccine  Aged Out  *Topic was postponed. The date shown is not the original due date.       03/03/2024    6:29 PM  Advanced Directives  Does Patient Have a Medical Advance Directive? Yes  Type of Advance Directive Healthcare Power of Attorney  Does patient want to make changes to medical advance directive? Yes (ED - send information to MyChart)    Vision: Annual vision screenings are recommended for early detection of glaucoma, cataracts, and diabetic retinopathy. These exams can also reveal signs of chronic conditions such as diabetes and high blood pressure.  Dental: Annual dental screenings help detect early signs of oral cancer, gum disease, and other conditions linked to overall health, including heart  disease and diabetes.  Please see the attached documents for additional preventive care recommendations.

## 2024-03-04 NOTE — Progress Notes (Signed)
 Chief Complaint  Patient presents with   Medicare Wellness     Subjective:   Frederick Hicks is a 68 y.o. male who presents for a Medicare Annual Wellness Visit.  Allergies (verified) Prednisone , Lisinopril, Poison ivy extract, and Simvastatin   History: Past Medical History:  Diagnosis Date   Hiatal hernia    Hyperlipidemia    Hypertension    Pre-diabetes    Past Surgical History:  Procedure Laterality Date   ANKLE FRACTURE SURGERY     left, pinning   COLONOSCOPY  04/01/2015   INGUINAL HERNIA REPAIR Right 04/26/2021   Procedure: OPEN RIGHT INGUINAL HERNIA REPAIR WITH MESH;  Surgeon: Paola Dreama SAILOR, MD;  Location: [facility] OR;  Service: General;  Laterality: Right;   INSERTION OF MESH Right 04/26/2021   Procedure: INSERTION OF MESH;  Surgeon: Paola Dreama SAILOR, MD;  Location: [facility] OR;  Service: General;  Laterality: Right;   POLYPECTOMY     TONSILLECTOMY     Family History  Problem Relation Age of Onset   Cancer Father 37       lung 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   Gout Maternal Uncle    Gout Maternal Grandfather    Colon cancer Neg Hx    Esophageal cancer Neg Hx    Rectal cancer Neg Hx    Stomach cancer Neg Hx    Colon polyps Neg Hx    Social History   Occupational History   Occupation: Engineering  Tobacco Use   Smoking status: Former    Types: Cigars    Quit date: 04/18/1997    Years since quitting: 26.8   Smokeless tobacco: Never  Vaping Use   Vaping status: Never Used  Substance and Sexual Activity   Alcohol use: Yes    Alcohol/week: 3.0 standard drinks of alcohol    Types: 1 Glasses of wine, 2 Cans of beer per week    Comment: maybe one drink a month   Drug use: No   Sexual activity: Yes   Tobacco Counseling Counseling given: Not Answered  SDOH Screenings   Food Insecurity: No Food Insecurity (03/04/2024)  Housing: Unknown (03/04/2024)  Transportation Needs: No Transportation Needs (03/04/2024)  Utilities: Not At Risk (03/04/2024)   Alcohol Screen: Low Risk  (02/19/2024)  Depression (PHQ2-9): Low Risk  (03/04/2024)  Financial Resource Strain: Low Risk  (02/19/2024)  Physical Activity: Sufficiently Active (03/04/2024)  Social Connections: Socially Integrated (03/04/2024)  Stress: No Stress Concern Present (03/04/2024)  Tobacco Use: Medium Risk (03/04/2024)  Health Literacy: Adequate Health Literacy (03/04/2024)   See flowsheets for full screening details  Depression Screen PHQ 2 & 9 Depression Scale- Over the past 2 weeks, how often have you been bothered by any of the following problems? Little interest or pleasure in doing things: 0 Feeling down, depressed, or hopeless (PHQ Adolescent also includes...irritable): 0 PHQ-2 Total Score: 0     Goals Addressed               This Visit's Progress     maintain health and activity (pt-stated)        Maintain health and activity        Visit info / Clinical Intake: Medicare Wellness Visit Type:: Subsequent Annual Wellness Visit Persons participating in visit:: patient Medicare Wellness Visit Mode:: In-person (required for WTM) Information given by:: patient Interpreter Needed?: No Pre-visit prep was completed: yes AWV questionnaire completed by patient prior to visit?: yes Date:: 03/03/24 Living arrangements:: lives with spouse/significant other Patient's Overall Health Status  Rating: very good Typical amount of pain: none Does pain affect daily life?: no Are you currently prescribed opioids?: no  Dietary Habits and Nutritional Risks How many meals a day?: 3 Eats fruit and vegetables daily?: yes Most meals are obtained by: preparing own meals Diabetic:: no  Functional Status Activities of Daily Living (to include ambulation/medication): (Patient-Rptd) Independent Ambulation: Independent with device- listed below Home Assistive Devices/Equipment: Eyeglasses Home Management: (Patient-Rptd) Independent Manage your own finances?: yes Primary  transportation is: driving Concerns about vision?: no *vision screening is required for WTM* Concerns about hearing?: (!) yes Uses hearing aids?: (!) yes (hearing aid) Hear whispered voice?: yes  Fall Screening Falls in the past year?: (Patient-Rptd) 0 Number of falls in past year: 0 Was there an injury with Fall?: 0 Fall Risk Category Calculator: 0 Patient Fall Risk Level: Low Fall Risk  Fall Risk Patient at Risk for Falls Due to: No Fall Risks Fall risk Follow up: Falls prevention discussed  Home and Transportation Safety: All rugs have non-skid backing?: yes All stairs or steps have railings?: yes Grab bars in the bathtub or shower?: yes Have non-skid surface in bathtub or shower?: yes Good home lighting?: yes Regular seat belt use?: yes Hospital stays in the last year:: no  Cognitive Assessment Difficulty concentrating, remembering, or making decisions? : no Will 6CIT or Mini Cog be Completed: no 6CIT or Mini Cog Declined: patient alert, oriented, able to answer questions appropriately and recall recent events  Advance Directives (For Healthcare) Does Patient Have a Medical Advance Directive?: Yes Does patient want to make changes to medical advance directive?: Yes (ED - send information to MyChart) Type of Advance Directive: Healthcare Power of Attorney  Reviewed/Updated  Reviewed/Updated: Reviewed All (Medical, Surgical, Family, Medications, Allergies, Care Teams, Patient Goals)        Objective:    Today's Vitals   03/04/24 1139  BP: 138/76  Pulse: 66  Temp: (!) 97.3 F (36.3 C)  SpO2: 95%  Weight: 235 lb 3.2 oz (106.7 kg)  Height: 6' 1.5 (1.867 m)   Body mass index is 30.61 kg/m.  Current Medications (verified) Outpatient Encounter Medications as of 03/04/2024  Medication Sig   amLODipine  (NORVASC ) 5 MG tablet TAKE 1 TABLET (5 MG TOTAL) BY MOUTH DAILY.   aspirin 81 MG tablet Take 81 mg by mouth daily.   meloxicam  (MOBIC ) 15 MG tablet Take 15 mg  by mouth daily as needed for pain.   rosuvastatin  (CRESTOR ) 40 MG tablet Take 1 tablet (40 mg total) by mouth daily.   tizanidine (ZANAFLEX) 2 MG capsule Take 2 mg by mouth 3 (three) times daily.   valACYclovir  (VALTREX ) 1000 MG tablet Take 2 tablets (2,000 mg total) by mouth 2 (two) times daily as needed (cold sore). Take for 1 day   No facility-administered encounter medications on file as of 03/04/2024.   Hearing/Vision screen Hearing Screening - Comments:: Hearing aids  Vision Screening - Comments:: Wears rx glasses - up to date with routine eye exams with Dr Evalene Gianotti  Immunizations and Health Maintenance Health Maintenance  Topic Date Due   Colonoscopy  02/19/2025 (Originally 12/25/2023)   Hepatitis C Screening  02/19/2025 (Originally 05/27/1973)   COVID-19 Vaccine (10 - 2025-26 season) 06/29/2024   Medicare Annual Wellness (AWV)  03/04/2025   DTaP/Tdap/Td (3 - Td or Tdap) 11/26/2031   Pneumococcal Vaccine: 50+ Years  Completed   Influenza Vaccine  Completed   Zoster Vaccines- Shingrix  Completed   Meningococcal B Vaccine  Aged  Out        Assessment/Plan:  This is a routine wellness examination for Burley.  Patient Care Team: Kennyth Worth HERO, MD as PCP - General (Family Medicine) Lita Lye, OD as Referring Physician (Optometry) Dow Frederic BIRCH, MD as Referring Physician (Orthopedic Surgery)  I have personally reviewed and noted the following in the patient's chart:   Medical and social history Use of alcohol, tobacco or illicit drugs  Current medications and supplements including opioid prescriptions. Functional ability and status Nutritional status Physical activity Advanced directives List of other physicians Hospitalizations, surgeries, and ER visits in previous 12 months Vitals Screenings to include cognitive, depression, and falls Referrals and appointments  No orders of the defined types were placed in this encounter.  In addition, I have reviewed  and discussed with patient certain preventive protocols, quality metrics, and best practice recommendations. A written personalized care plan for preventive services as well as general preventive health recommendations were provided to patient.   Ellouise VEAR Haws, LPN   [id number]   Return in 1 year 03/11/25 for AWV   After Visit Summary: (In Person-Printed) AVS printed and given to the patient  Nurse Notes: nothing significant at this time

## 2024-03-12 ENCOUNTER — Other Ambulatory Visit: Payer: Self-pay | Admitting: Cardiology

## 2024-03-12 DIAGNOSIS — E785 Hyperlipidemia, unspecified: Secondary | ICD-10-CM

## 2024-03-12 DIAGNOSIS — E7800 Pure hypercholesterolemia, unspecified: Secondary | ICD-10-CM

## 2024-03-19 ENCOUNTER — Other Ambulatory Visit: Payer: Self-pay

## 2024-03-19 ENCOUNTER — Emergency Department (HOSPITAL_BASED_OUTPATIENT_CLINIC_OR_DEPARTMENT_OTHER)

## 2024-03-19 ENCOUNTER — Emergency Department (HOSPITAL_BASED_OUTPATIENT_CLINIC_OR_DEPARTMENT_OTHER): Admitting: Radiology

## 2024-03-19 ENCOUNTER — Emergency Department (HOSPITAL_BASED_OUTPATIENT_CLINIC_OR_DEPARTMENT_OTHER)
Admission: EM | Admit: 2024-03-19 | Discharge: 2024-03-19 | Disposition: A | Discharge status: Home / Self Care | Attending: Emergency Medicine | Admitting: Emergency Medicine

## 2024-03-19 DIAGNOSIS — S20212A Contusion of left front wall of thorax, initial encounter: Secondary | ICD-10-CM

## 2024-03-19 DIAGNOSIS — R0781 Pleurodynia: Secondary | ICD-10-CM | POA: Insufficient documentation

## 2024-03-19 DIAGNOSIS — Z7982 Long term (current) use of aspirin: Secondary | ICD-10-CM | POA: Diagnosis not present

## 2024-03-19 DIAGNOSIS — I10 Essential (primary) hypertension: Secondary | ICD-10-CM | POA: Insufficient documentation

## 2024-03-19 DIAGNOSIS — W010XXA Fall on same level from slipping, tripping and stumbling without subsequent striking against object, initial encounter: Secondary | ICD-10-CM | POA: Diagnosis not present

## 2024-03-19 DIAGNOSIS — R0789 Other chest pain: Secondary | ICD-10-CM

## 2024-03-19 DIAGNOSIS — S299XXA Unspecified injury of thorax, initial encounter: Secondary | ICD-10-CM | POA: Diagnosis not present

## 2024-03-19 MED ORDER — LIDOCAINE 5 % EX OINT: 1.0000 | TOPICAL_OINTMENT | Freq: Four times a day (QID) | CUTANEOUS | 0 refills | Status: AC | PRN

## 2024-03-19 MED ORDER — LIDOCAINE 5 % EX PTCH
1.0000 | MEDICATED_PATCH | CUTANEOUS | Status: DC
  Administered 2024-03-19: 1 via TRANSDERMAL
  Filled 2024-03-19: qty 1

## 2024-03-19 MED ORDER — ACETAMINOPHEN 500 MG PO TABS
1000.0000 mg | ORAL_TABLET | Freq: Once | ORAL | Status: AC
  Administered 2024-03-19: 1000 mg via ORAL
  Filled 2024-03-19: qty 2

## 2024-03-19 NOTE — ED Triage Notes (Signed)
 Pt caox4 ambulatory c/o L rib pain since tripping over his dog approx 2 hrs ago and falling onto his left side on a table. Pain on respiration, denies SOB.

## 2024-03-19 NOTE — Discharge Instructions (Addendum)
 Please follow-up with your primary care provider.  Seek emergency care if experiencing any new or worsening symptoms.

## 2024-03-19 NOTE — ED Provider Notes (Signed)
 [place] EMERGENCY DEPARTMENT AT [facility] Provider Note   CSN: [id number] Arrival date & time: 03/19/24  1651     Patient presents with: Frederick Hicks is a 68 y.o. male with PMHx HTN, HLD who presents to ED concerned for left sided rib pain after tripping and falling over his dog earlier today.  Patient denies head trauma, LOC, seizure, blood thinner.  Denies headache or vision changes.  Denies SOB or anginal chest pain.  Denies any other acute symptoms today.    Fall       Prior to Admission medications   Medication Sig Start Date End Date Taking? Authorizing Provider  lidocaine  (XYLOCAINE ) 5 % ointment Apply 1 Application topically 4 (four) times daily as needed for up to 5 days. 03/19/24 03/24/24 Yes Farah Lepak, Nidia F, PA-C  amLODipine  (NORVASC ) 5 MG tablet TAKE 1 TABLET (5 MG TOTAL) BY MOUTH DAILY. 12/27/23   Patwardhan, Newman PARAS, MD  aspirin 81 MG tablet Take 81 mg by mouth daily.    [provider]  meloxicam  (MOBIC ) 15 MG tablet Take 15 mg by mouth daily as needed for pain.    [provider]  rosuvastatin  (CRESTOR ) 40 MG tablet TAKE 1 TABLET BY MOUTH EVERY DAY 03/18/24   Patwardhan, Manish J, MD  tizanidine (ZANAFLEX) 2 MG capsule Take 2 mg by mouth 3 (three) times daily.    [provider]  valACYclovir  (VALTREX ) 1000 MG tablet Take 2 tablets (2,000 mg total) by mouth 2 (two) times daily as needed (cold sore). Take for 1 day 02/20/24   Kennyth Worth HERO, MD    Allergies: Prednisone , Lisinopril, Poison ivy extract, and Simvastatin    Review of Systems  Musculoskeletal:        Rib pain    Updated Vital Signs BP (!) 173/79 (BP Location: Right Arm)   Pulse 63   Temp 98.6 F (37 C)   Resp 17   Ht 6' 1.5 (1.867 m)   Wt 106.6 kg   SpO2 99%   BMI 30.58 kg/m   Physical Exam Vitals and nursing note reviewed.  Constitutional:      General: He is not in acute distress.    Appearance: He is not ill-appearing or  toxic-appearing.  HENT:     Head: Normocephalic and atraumatic.     Mouth/Throat:     Mouth: Mucous membranes are moist.  Eyes:     General: No scleral icterus.       Right eye: No discharge.        Left eye: No discharge.     Conjunctiva/sclera: Conjunctivae normal.  Cardiovascular:     Rate and Rhythm: Normal rate and regular rhythm.     Pulses: Normal pulses.     Heart sounds: Normal heart sounds. No murmur heard. Pulmonary:     Effort: Pulmonary effort is normal. No respiratory distress.     Breath sounds: Normal breath sounds. No wheezing, rhonchi or rales.  Abdominal:     General: Abdomen is flat.  Musculoskeletal:     Right lower leg: No edema.     Left lower leg: No edema.     Comments: Right lower posterior lateral ribs are tender to palpation.  No crepitus or step-off appreciated.  No overlying bruising or erythema appreciated  Skin:    General: Skin is warm and dry.     Findings: No rash.  Neurological:     General: No focal deficit present.  Mental Status: He is alert and oriented to person, place, and time. Mental status is at baseline.     Comments: GCS 15. Speech is goal oriented. No deficits appreciated to CN III-XII.  Moves extremities without ataxia.  Psychiatric:        Mood and Affect: Mood normal.        Behavior: Behavior normal.     (all labs ordered are listed, but only abnormal results are displayed) Labs Reviewed - No data to display  EKG: None  Radiology: DG Ribs Unilateral W/Chest Left Result Date: 03/19/2024 CLINICAL DATA:  Status post fall with subsequent left rib pain. EXAM: LEFT RIBS AND CHEST - 3+ VIEW COMPARISON:  November 25, 2021 FINDINGS: A radiopaque marker was placed at the site of the patient's pain. No fracture or other bone lesions are seen involving the ribs. There is no evidence of pneumothorax or pleural effusion. Both lungs are clear. Heart size and mediastinal contours are within normal limits. IMPRESSION: Negative.  Electronically Signed   By: Suzen Dials M.D.   On: 03/19/2024 18:10     Procedures   Medications Ordered in the ED  lidocaine  (LIDODERM ) 5 % 1 patch (1 patch Transdermal Patch Applied 03/19/24 1745)  acetaminophen  (TYLENOL ) tablet 1,000 mg (1,000 mg Oral Given 03/19/24 1744)                                    Medical Decision Making Amount and/or Complexity of Data Reviewed Radiology: ordered.  Risk OTC drugs. Prescription drug management.   This patient presents to the ED for concern of rib pain, this involves an extensive number of treatment options, and is a complaint that carries with it a high risk of complications and morbidity.  The differential diagnosis includes hemarthrosis, gout, septic joint, fracture, tendonitis, muscle strain, bursitis, compartment syndrome   Co morbidities that complicate the patient evaluation  HTN, HLD   Additional history obtained:  Dr. Kennyth PCP   Problem List / ED Course / Critical interventions / Medication management  Patient presents ED concern for left rib pain after mechanical fall earlier today.  Denies any alarming symptoms such as shortness of breath or headache.  Physical exam with tenderness palpation of posterior lateral lower ribs.  Rest of physical exam reassuring.  Patient afebrile with stable vitals. I ordered imaging studies including left rib x-ray. I independently visualized and interpreted imaging. I agree with the radiologist interpretation of no acute process.  Wife at bedside worried that xray might have missed rib fractures and would feel safer if patient received CT scan. I have placed this order which is now pending. I have reviewed the patients home medicines and have made adjustments as needed    Social Determinants of Health:  None   6:45PM Care of [facility] transferred to PA Upstill at the end of my shift as the patient will require reassessment once labs/imaging have resulted. Patient  presentation, ED course, and plan of care discussed with review of all pertinent labs and imaging. Please see his/her note for further details regarding further ED course and disposition. Plan at time of handoff is reassess patient after CT scan. This may be altered or completely changed at the discretion of the oncoming team pending results of further workup.       Final diagnoses:  Rib pain    ED Discharge Orders  Ordered    lidocaine  (XYLOCAINE ) 5 % ointment  4 times daily PRN        03/19/24 1831               Hoy Nidia FALCON, PA-C 03/19/24 1840    Emil Share, DO 03/19/24 1921

## 2024-03-19 NOTE — ED Provider Notes (Signed)
  Physical Exam  BP (!) 173/79 (BP Location: Right Arm)   Pulse 63   Temp 98.6 F (37 C)   Resp 17   Ht 6' 1.5 (1.867 m)   Wt 106.6 kg   SpO2 99%   BMI 30.58 kg/m   Physical Exam  Procedures  Procedures  ED Course / MDM    Medical Decision Making Amount and/or Complexity of Data Reviewed Radiology: ordered.  Risk OTC drugs. Prescription drug management.   Here with left chest pain after fall. Negative CXR, VSS, mild HTN with history. No hypoxia. CT pending at time of sign out.   CT Chest w/o CM - negative for injury. He is stable for discharge per plan of previous treatment team.        Odell Balls, PA-C 03/19/24 2004    [address], DO 03/19/24 2036

## 2024-03-19 NOTE — ED Notes (Addendum)
Patient verbalizes understanding of discharge instructions. Opportunity for questioning and answers were provided. Armband removed by staff, pt discharged from ED. Ambulated out to lobby with wife  

## 2024-03-22 ENCOUNTER — Encounter (HOSPITAL_COMMUNITY): Payer: Self-pay | Admitting: Surgery

## 2024-03-22 ENCOUNTER — Other Ambulatory Visit: Payer: Self-pay | Admitting: Cardiology

## 2024-03-22 DIAGNOSIS — I10 Essential (primary) hypertension: Secondary | ICD-10-CM

## 2024-03-25 ENCOUNTER — Ambulatory Visit: Admitting: Family Medicine

## 2024-03-25 ENCOUNTER — Encounter: Payer: Self-pay | Admitting: Family Medicine

## 2024-03-25 VITALS — BP 130/68 | HR 60 | Temp 97.9°F | Resp 14 | Ht 73.0 in | Wt 235.6 lb

## 2024-03-25 DIAGNOSIS — R0789 Other chest pain: Secondary | ICD-10-CM

## 2024-03-25 DIAGNOSIS — E785 Hyperlipidemia, unspecified: Secondary | ICD-10-CM

## 2024-03-25 DIAGNOSIS — I10 Essential (primary) hypertension: Secondary | ICD-10-CM

## 2024-03-25 NOTE — Progress Notes (Signed)
   Frederick Hicks is a 68 y.o. male who presents today for an office visit.  Assessment/Plan:  New/Acute Problems: Rib Pain  Reviewed recent ED visit and imaging.  Negative for fracture though does have contusion to the area.  Discussed pain control which is currently manageable.  Anticipate this will continue to improve over the next several weeks.  He will let us  know if he has any issues  Calcified granuloma Also reviewed recent CT scan which showed stable calcified granuloma which was also on previous imaging.  Reassured patient that this is benign and no further workup is needed.   Chronic Problems Addressed Today: Essential hypertension At goal today on amlodipine  5 mg daily.  Has been well-controlled at home as well.  He will continue to monitor and let us  know if persistently elevated.  Dyslipidemia Stable on Crestor  40 mg daily.     Subjective:  HPI:  See assessment / plan for status of chronic conditions.   Discussed the use of AI scribe software for clinical note transcription with the patient, who gave verbal consent to proceed.  History of Present Illness Frederick Hicks is a 68 year old male who presents for follow-up after a fall resulting in a rib injury.  He experienced a fall six days ago at home, tripping over his dog and hitting the side of his body on the corner of a table. He describes the impact as feeling like an 'earthquake' and notes significant bruising on his side. No head injury occurred during the fall.  Following the fall, he visited the emergency department where a CT scan was performed, revealing a calcified granuloma in his lungs. 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 He denies any history of pneumonia but mentions past episodes of colds, flu, and bronchitis.  He is currently experiencing pain on the side where he fell, which is managed with an ointment  applied by his spouse. The pain is improving but he is unable to lay on that side while sleeping. He describes the pain as a 'jab' and notes tenderness from the impact with a glass corner.  He has a history of COPD, which sometimes makes it difficult for him to breathe deeply. He has not noticed any change in his breathing since the fall.  He is monitoring his blood pressure and notes that it was mildly elevated during a previous visit. He is also keeping track of his blood sugar levels, as his A1c was slightly elevated. He has a history of borderline diabetes and is cautious about his sugar intake, especially during the holiday season.         Objective:  Physical Exam: BP 130/68   Pulse 60   Temp 97.9 F (36.6 C) (Temporal)   Resp 14   Ht 6' 1 (1.854 m)   Wt 235 lb 9.6 oz (106.9 kg)   SpO2 98%   BMI 31.08 kg/m   Gen: No acute distress, resting comfortably CV: Regular rate and rhythm with no murmurs appreciated Pulm: Normal work of breathing, clear to auscultation bilaterally with no crackles, wheezes, or rhonchi MUSCULOSKELETAL: Ecchymosis noted along left lower lateral rib wall. Neuro: Grossly normal, moves all extremities Psych: Normal affect and thought content      Placido Hangartner M. Kennyth, MD 03/25/2024 10:59 AM

## 2024-03-25 NOTE — Patient Instructions (Signed)
 It was very nice to see you today!  VISIT SUMMARY: You visited us  today for a follow-up after a fall that resulted in a rib injury. We discussed your current pain management, findings from a recent CT scan, and reviewed your ongoing health conditions including COPD, hypertension, and prediabetes.  YOUR PLAN: LEFT CHEST WALL CONTUSION: You have a bruise and tenderness from a fall with no rib fracture. -Continue applying the ointment for pain management. -Avoid lying on the affected side while sleeping.  BENIGN CALCIFIED GRANULOMA OF LUNG: A calcified granuloma was observed on your CT scan. It is benign and requires no intervention. -No treatment is needed for the calcified granuloma.  CORONARY ARTERY DISEASE WITH MILD BLOCKAGE: There is mild coronary artery blockage. -Focus on managing your cholesterol and blood pressure. -Continue monitoring your cholesterol levels and maintain blood pressure control.  HYPERTENSION: Your blood pressure is mildly elevated. -Continue monitoring your blood pressure at home.  PREDIABETES: Your A1c is elevated. -Make dietary modifications to manage your blood sugar levels. -Continue monitoring your blood sugar levels.  Return in about 1 year (around 03/25/2025).   Take care, Dr Kennyth  PLEASE NOTE:  If you had any lab tests, please let us  know if you have not heard back within a few days. You may see your results on mychart before we have a chance to review them but we will give you a call once they are reviewed by us .   If we ordered any referrals today, please let us  know if you have not heard from their office within the next week.   If you had any urgent prescriptions sent in today, please check with the pharmacy within an hour of our visit to make sure the prescription was transmitted appropriately.   Please try these tips to maintain a healthy lifestyle:  Eat at least 3 REAL meals and 1-2 snacks per day.  Aim for no more than 5 hours between  eating.  If you eat breakfast, please do so within one hour of getting up.   Each meal should contain half fruits/vegetables, one quarter protein, and one quarter carbs (no bigger than a computer mouse)  Cut down on sweet beverages. This includes juice, soda, and sweet tea.   Drink at least 1 glass of water with each meal and aim for at least 8 glasses per day  Exercise at least 150 minutes every week.

## 2024-03-25 NOTE — Assessment & Plan Note (Signed)
Stable on Crestor 40 mg daily.

## 2024-03-25 NOTE — Assessment & Plan Note (Signed)
 At goal today on amlodipine  5 mg daily.  Has been well-controlled at home as well.  He will continue to monitor and let us  know if persistently elevated.

## 2024-03-29 NOTE — Progress Notes (Signed)
 Prem Coykendall                                          MRN: [id number]   03/29/2024   The VBCI Quality Team Specialist reviewed this patient medical record for the purposes of chart review for care gap closure. The following were reviewed: abstraction for care gap closure-controlling blood pressure.    VBCI Quality Team

## 2024-04-23 ENCOUNTER — Ambulatory Visit (HOSPITAL_COMMUNITY)
Admission: RE | Admit: 2024-04-23 | Discharge: 2024-04-23 | Disposition: A | Discharge status: Home / Self Care | Source: Ambulatory Visit | Attending: Cardiology | Admitting: Cardiology

## 2024-04-23 DIAGNOSIS — I10 Essential (primary) hypertension: Secondary | ICD-10-CM | POA: Diagnosis not present

## 2024-04-23 DIAGNOSIS — E785 Hyperlipidemia, unspecified: Secondary | ICD-10-CM | POA: Diagnosis not present

## 2024-04-23 DIAGNOSIS — I77810 Thoracic aortic ectasia: Secondary | ICD-10-CM | POA: Diagnosis present

## 2024-04-23 LAB — ECHOCARDIOGRAM COMPLETE
Area-P 1/2: 2.66 cm2
S' Lateral: 3.1 cm

## 2024-04-24 ENCOUNTER — Telehealth: Payer: Self-pay | Admitting: *Deleted

## 2024-04-24 NOTE — Telephone Encounter (Signed)
 Copied from CRM #[id number]. Topic: Clinical - Prescription Issue >> Apr 24, 2024  3:07 PM Pinkey ORN wrote: Reason for CRM: Prescription >> Apr 24, 2024  3:11 PM Pinkey ORN wrote: Patient called in, states that he and his wife split up and the wife took his medications. Patient states that she's refusing to give them to him, therefor he is without his blood pressure medication and is asking that a prescription is called into the pharmacy.   [facility]  [address]    Please advise  [employer]

## 2024-04-25 ENCOUNTER — Other Ambulatory Visit: Payer: Self-pay | Admitting: *Deleted

## 2024-04-25 DIAGNOSIS — I10 Essential (primary) hypertension: Secondary | ICD-10-CM

## 2024-04-25 MED ORDER — AMLODIPINE BESYLATE 5 MG PO TABS: 5.0000 mg | ORAL_TABLET | Freq: Every day | ORAL | 1 refills | Status: DC

## 2024-04-25 NOTE — Telephone Encounter (Signed)
 Rx send to [facility] in [place]

## 2024-04-25 NOTE — Telephone Encounter (Signed)
 Please send in new prescription if needed.

## 2024-04-25 NOTE — Telephone Encounter (Signed)
 SABRA

## 2024-04-26 ENCOUNTER — Ambulatory Visit: Payer: Self-pay | Admitting: Cardiology

## 2024-05-21 ENCOUNTER — Ambulatory Visit: Admitting: Cardiology

## 2024-05-21 ENCOUNTER — Encounter: Payer: Self-pay | Admitting: Cardiology

## 2024-05-21 ENCOUNTER — Other Ambulatory Visit (HOSPITAL_COMMUNITY): Payer: Self-pay

## 2024-05-21 VITALS — BP 143/87 | HR 66 | Ht 73.0 in | Wt 242.0 lb

## 2024-05-21 DIAGNOSIS — E782 Mixed hyperlipidemia: Secondary | ICD-10-CM

## 2024-05-21 DIAGNOSIS — I2510 Atherosclerotic heart disease of native coronary artery without angina pectoris: Secondary | ICD-10-CM | POA: Diagnosis not present

## 2024-05-21 DIAGNOSIS — I10 Essential (primary) hypertension: Secondary | ICD-10-CM

## 2024-05-21 MED ORDER — AMLODIPINE BESYLATE 10 MG PO TABS
10.0000 mg | ORAL_TABLET | Freq: Every day | ORAL | 3 refills | Status: AC
  Filled 2024-05-21: qty 90, 90d supply, fill #0

## 2024-05-21 NOTE — Progress Notes (Signed)
 " Cardiology Office Note:  .   Date:  05/21/2024  ID:  Frederick Hicks, DOB 11/19/1955, MRN [id number] PCP: Kennyth Worth HERO, MD  [facility] Providers Cardiologist:  Newman Lawrence, MD PCP: Kennyth Worth HERO, MD  Chief Complaint  Patient presents with   Coronary artery calcification     Orlander Norwood is a 69 y.o. male with hypertension, hyperlipidemia, prediabetes, former smoker, coronary calcification   Discussed the use of AI scribe software for clinical note transcription with the patient, who gave verbal consent to proceed.  History of Present Illness Aaric Dolph is a 69 year old male with COPD and hypertension who presents for follow-up of his cardiovascular health.  He has had no new cardiovascular symptoms since his last visit.  He remains active without formal exercise. He has no chest pain and no exertional shortness of breath, with only occasional dyspnea attributed to COPD.  His current medications are amlodipine  5 mg daily, aspirin 81 mg daily, and rosuvastatin  40 mg daily. He does not take glucose-lowering medication, though his A1c has been in the prediabetes range, and he is watching his diet.  He monitors his blood pressure at home and notes that elevated readings usually decrease without intervention.  He quit smoking many years ago. His last cholesterol check in November showed good control.  He had a stress test last year and remains asymptomatic. He also had an echocardiogram.      Vitals:   05/21/24 0910  BP: (!) 143/87  Pulse: 66  SpO2: 99%      Review of Systems  Cardiovascular:  Negative for chest pain, dyspnea on exertion, leg swelling, palpitations and syncope.        Studies Reviewed: SABRA        EKG 05/21/2024: Sinus rhythm 59 bpm Nonspecific T wave abnormality When compared with ECG of 23-Mar-2023 11:01,  No significant change was found     Echocardiogram 04/2024:  1. Left ventricular ejection fraction, by estimation, is 65 to  70%. The  left ventricle has normal function. The left ventricle has no regional  wall motion abnormalities. Left ventricular diastolic parameters were  normal.   2. Right ventricular systolic function is normal. The right ventricular  size is normal. There is normal pulmonary artery systolic pressure.   3. The mitral valve is normal in structure. Trivial mitral valve  regurgitation.   4. The aortic valve is tricuspid. Aortic valve regurgitation is trivial.   5. The inferior vena cava is dilated in size with >50% respiratory  variability, suggesting right atrial pressure of 8 mmHg.  Aorta: The aortic root and ascending aorta are structurally normal, with  no evidence of dilitation. Ascending aorta measurements are within normal  limits for age when indexed to body surface area.   Comparison(s): The left ventricular function is unchanged.   CT chest 2024: 1. Lung-RADS 2S, benign appearance or behavior. Continue annual screening with low-dose chest CT without contrast in 12 months. 2. The S modifier above refers to potentially clinically significant non lung cancer related findings. Specifically, there is aortic atherosclerosis, in addition to left main and 2 vessel coronary artery disease. Please note that although the presence of coronary artery calcium  documents the presence of coronary artery disease, the severity of this disease and any potential stenosis cannot be assessed on this non-gated CT examination. Assessment for potential risk factor modification, dietary therapy or pharmacologic therapy may be warranted, if clinically indicated. 3. Mild diffuse bronchial wall thickening with mild centrilobular  and paraseptal emphysema; imaging findings suggestive of underlying COPD. 4. Hepatic steatosis.   Aortic Atherosclerosis (ICD10-I70.0) and Emphysema (ICD10-J43.9).  Stress test 2024:    Findings are consistent with a very small area of ischemia. The study is low risk.   LV  perfusion is abnormal. Defect 1: There is a small defect with mild reduction in uptake present in the basal inferoseptal location(s) that is reversible. There is normal wall motion in the defect area. Consistent with ischemia.   No ST deviation was noted.   Left ventricular function is normal. Nuclear stress EF: 55%. The left ventricular ejection fraction is normal (55-65%). End diastolic cavity size is normal.   Average exercise capacity. Patient exercised for 8 min and 1 sec. Maximum HR of 146 bpm. MPHR 96.0%. Peak METS 10.1.   Hypertensive blood pressure response noted during stress.    Labs 02/2024: Chol 116, TG 115, HDL 35, LDL 58 HbA1C 6.3% Hb 14.6 Cr 1.1 TSH 1.9   Physical Exam Vitals and nursing note reviewed.  Constitutional:      General: He is not in acute distress. Neck:     Vascular: No JVD.  Cardiovascular:     Rate and Rhythm: Normal rate and regular rhythm.     Heart sounds: Normal heart sounds. No murmur heard. Pulmonary:     Effort: Pulmonary effort is normal.     Breath sounds: Normal breath sounds. No wheezing or rales.  Musculoskeletal:     Right lower leg: No edema.     Left lower leg: No edema.      VISIT DIAGNOSES:   ICD-10-CM   1. Primary hypertension  I10 EKG 12-Lead    amLODipine  (NORVASC ) 10 MG tablet    2. Mixed hyperlipidemia  E78.2 EKG 12-Lead    3. Coronary artery disease involving native coronary artery of native heart without angina pectoris  I25.10 EKG 12-Lead       Pascal Stiggers is a 69 y.o. male with hypertension, hyperlipidemia, prediabetes, former smoker, coronary calcification Assessment & Plan Primary hypertension Blood pressure slightly elevated today. Discussed potential side effects of increased amlodipine . - Increased amlodipine  to 10 mg daily. - Sent new prescription for 90 pills. - Monitor blood pressure at home regularly. - Report blood pressure readings via MyChart. - Scheduled follow-up nurse visit in four weeks  for blood pressure check.  Atherosclerotic heart disease of native coronary artery with angina: No current chest pain or angina symptoms. Previous stress test showed small area of inadequate blood flow, no intervention needed. - Continue current medications, including Aspirin, statin.  Mixed hyperlipidemia: Cholesterol levels well controlled as of last check in November. - Continue Crestor  40 mg daily.       Meds ordered this encounter  Medications   amLODipine  (NORVASC ) 10 MG tablet    Sig: Take 1 tablet (10 mg total) by mouth daily.    Dispense:  90 tablet    Refill:  3     F/u as needed  Signed, Newman JINNY Lawrence, MD  "

## 2024-05-21 NOTE — Patient Instructions (Signed)
 Medication Instructions:  INCREASE Amlodipine  to 10 mg daily   *If you need a refill on your cardiac medications before your next appointment, please call your pharmacy*  Follow-Up: At [facility], you and your health needs are our priority.  As part of our continuing mission to provide you with exceptional heart care, our providers are all part of one team.  This team includes your primary Cardiologist (physician) and Advanced Practice Providers or APPs (Physician Assistants and Nurse Practitioners) who all work together to provide you with the care you need, when you need it.  Your next appointment:   4 week(s) blood pressure recheck   Provider:   One of our Advanced Practice Providers (APPs): Morse Clause, PA-C  Hanh Waddell Daniels, PA-C  Saddie Cleaves, NP  Olivia Pavy, PA-C Miriam Shams, NP  Leontine Salen, PA-C Josefa Beauvais, NP  [facility], PA-C [place], PA-C  [place], PA-C Hunter Davis, [place]  Damien Braver, NP Jon Hails, PA-C  Waddell Donath, PA-C Dayna Dunn, PA-C  [place], PA-C [place], [place] Glendia Ferrier, PA-C Callie Goodrich, PA-C  Katlyn West, NP Thom Sluder, PA-C  Alyssa White, NP Rollo Louder, PA-C Xika Zhao, NP    Lamarr Satterfield, NP        We recommend signing up for the patient portal called MyChart.  Sign up information is provided on this After Visit Summary.  MyChart is used to connect with patients for Virtual Visits (Telemedicine).  Patients are able to view lab/test results, encounter notes, upcoming appointments, etc.  Non-urgent messages can be sent to your provider as well.   To learn more about what you can do with MyChart, go to [email or web address].   Other Instructions

## 2024-06-19 ENCOUNTER — Encounter

## 2024-07-04 ENCOUNTER — Encounter: Admitting: Internal Medicine

## 2024-10-02 ENCOUNTER — Ambulatory Visit: Admitting: Physician Assistant

## 2025-02-20 ENCOUNTER — Encounter: Admitting: Family Medicine

## 2025-03-11 ENCOUNTER — Ambulatory Visit
